# Patient Record
Sex: Male | Born: 1965
Health system: Southern US, Community
[De-identification: ages and names within clinical notes are randomized; demographics above are authoritative.]

## PROBLEM LIST (undated history)

## (undated) DIAGNOSIS — E785 Hyperlipidemia, unspecified: Secondary | ICD-10-CM

## (undated) DIAGNOSIS — F419 Anxiety disorder, unspecified: Secondary | ICD-10-CM

## (undated) DIAGNOSIS — F32A Depression, unspecified: Secondary | ICD-10-CM

## (undated) DIAGNOSIS — N189 Chronic kidney disease, unspecified: Secondary | ICD-10-CM

## (undated) DIAGNOSIS — F329 Major depressive disorder, single episode, unspecified: Secondary | ICD-10-CM

## (undated) DIAGNOSIS — I10 Essential (primary) hypertension: Secondary | ICD-10-CM

## (undated) HISTORY — PX: VASECTOMY: SHX75

## (undated) HISTORY — DX: Depression, unspecified: F32.A

## (undated) HISTORY — DX: Anxiety disorder, unspecified: F41.9

## (undated) HISTORY — DX: Hyperlipidemia, unspecified: E78.5

## (undated) HISTORY — DX: Major depressive disorder, single episode, unspecified: F32.9

## (undated) HISTORY — DX: Essential (primary) hypertension: I10

## (undated) HISTORY — DX: Chronic kidney disease, unspecified: N18.9

---

## 2011-07-19 LAB — CBC AND DIFFERENTIAL
HCT: 44 % (ref 41–53)
Hemoglobin: 15.3 g/dL (ref 13.5–17.5)
Platelets: 246 10*3/uL (ref 150–399)
WBC: 5.4 10^3/mL

## 2011-07-19 LAB — BASIC METABOLIC PANEL
BUN: 14 mg/dL (ref 4–21)
Creatinine: 1.2 mg/dL (ref 0.6–1.3)
Glucose: 103 mg/dL
Potassium: 4.5 mmol/L (ref 3.4–5.3)
Sodium: 140 mmol/L (ref 137–147)

## 2011-07-19 LAB — LIPID PANEL
Cholesterol: 176 mg/dL (ref 0–200)
HDL: 40 mg/dL (ref 35–70)
LDL Cholesterol: 103 mg/dL
Triglycerides: 168 mg/dL — AB (ref 40–160)

## 2011-07-19 LAB — HEPATIC FUNCTION PANEL
ALT: 14 U/L (ref 10–40)
AST: 15 U/L (ref 14–40)
Alkaline Phosphatase: 47 U/L (ref 25–125)
Bilirubin, Total: 0.8 mg/dL

## 2012-08-24 ENCOUNTER — Encounter: Payer: Self-pay | Admitting: Family Medicine

## 2012-08-24 ENCOUNTER — Ambulatory Visit (INDEPENDENT_AMBULATORY_CARE_PROVIDER_SITE_OTHER): Payer: BC Managed Care – PPO | Admitting: Family Medicine

## 2012-08-24 VITALS — BP 115/72 | HR 63 | Temp 98.4°F | Resp 18 | Ht 71.0 in | Wt 239.0 lb

## 2012-08-24 DIAGNOSIS — F32A Depression, unspecified: Secondary | ICD-10-CM

## 2012-08-24 DIAGNOSIS — Z13220 Encounter for screening for lipoid disorders: Secondary | ICD-10-CM

## 2012-08-24 DIAGNOSIS — F341 Dysthymic disorder: Secondary | ICD-10-CM

## 2012-08-24 DIAGNOSIS — Z131 Encounter for screening for diabetes mellitus: Secondary | ICD-10-CM

## 2012-08-24 DIAGNOSIS — F419 Anxiety disorder, unspecified: Secondary | ICD-10-CM

## 2012-08-24 DIAGNOSIS — F329 Major depressive disorder, single episode, unspecified: Secondary | ICD-10-CM | POA: Insufficient documentation

## 2012-08-24 MED ORDER — CITALOPRAM HYDROBROMIDE 20 MG PO TABS: 20.0000 mg | ORAL_TABLET | Freq: Every day | ORAL | Status: DC

## 2012-08-24 NOTE — Progress Notes (Signed)
CC: Gabriel Little is a 47 y.o. male is here for Establish Care   Subjective: HPI:  Pleasant 47 year old here to establish care  He reports a long-standing history of depression and anxiety originally moderate in severity no improvement on Zoloft 3 years ago he switched to citalopram has had fantastic response up until the last few weeks. He is taking 10 mg a daily basis but expresses frustration with early morning awakening most nights of the week this occurs with thoughts of worrying about finances, job responsibilities. Symptoms are described as mild/moderate in severity but interfering with quality of life. He currently denies any depression, possibly to harm himself or others, mental disturbance other than that above.  Denies alcohol use tobacco use or recreational drug use.  Since being on Celexa he has not had any sexual dysfunction complaints or concerns. Above symptoms seemed to improve with distractions. Symptoms are gradually been worsening since onset 2- 3 weeks ago.   He believes it's been well over a year since cholesterol and diabetes screening  Review of Systems - General ROS: negative for - chills, fever, night sweats, weight gain or weight loss Ophthalmic ROS: negative for - decreased vision Psychological ROS: negative for - anxiety or depression ENT ROS: negative for - hearing change, nasal congestion, tinnitus or allergies Hematological and Lymphatic ROS: negative for - bleeding problems, bruising or swollen lymph nodes Breast ROS: negative Respiratory ROS: no cough, shortness of breath, or wheezing Cardiovascular ROS: no chest pain or dyspnea on exertion Gastrointestinal ROS: no abdominal pain, change in bowel habits, or black or bloody stools Genito-Urinary ROS: negative for - genital discharge, genital ulcers, incontinence or abnormal bleeding from genitals Musculoskeletal ROS: negative for - joint pain or muscle pain Neurological ROS: negative for - headaches or memory  loss Dermatological ROS: negative for lumps, mole changes, rash and skin lesion changes  Past Medical History  Diagnosis Date  . Depression   . Anxiety      Family History  Problem Relation Age of Onset  . Heart attack Mother   . Cancer Father     prostate  . Heart attack Father   . Heart attack Brother   . Depression Brother      History  Substance Use Topics  . Smoking status: Never Smoker   . Smokeless tobacco: Not on file  . Alcohol Use: No     Objective: Filed Vitals:   08/24/12 0937  BP: 115/72  Pulse: 63  Temp: 98.4 F (36.9 C)  Resp: 18    General: Alert and Oriented, No Acute Distress HEENT: Pupils equal, round, reactive to light. Conjunctivae clear.  External ears unremarkable, canals clear with intact TMs with appropriate landmarks.  Middle ear appears open without effusion. Pink inferior turbinates.  Moist mucous membranes, pharynx without inflammation nor lesions.  Neck supple without palpable lymphadenopathy nor abnormal masses. Lungs: Clear to auscultation bilaterally, no wheezing/ronchi/rales.  Comfortable work of breathing. Good air movement. Cardiac: Regular rate and rhythm. Normal S1/S2.  No murmurs, rubs, nor gallops.   Extremities: No peripheral edema.  Strong peripheral pulses.  Mental Status: No depression, anxiety, nor agitation. Skin: Warm and dry.  Assessment & Plan: Edd was seen today for establish care.  Diagnoses and associated orders for this visit:  Anxiety and depression - citalopram (CELEXA) 20 MG tablet; Take 1 tablet (20 mg total) by mouth daily.  Lipid screening - Lipid panel  Diabetes mellitus screening - BASIC METABOLIC PANEL WITH GFR  Other Orders - Discontinue:  citalopram (CELEXA) 10 MG tablet; Take 10 mg by mouth daily.    Anxiety and depression: Chronic uncontrolled condition increasing Celexa to 20 mg notify me in 2 weeks if symptoms are improving or worsening. He is due for diabetes screening and fasting  lipid panel which he will have obtained some time next week. Lab slip given today  Return in about 6 months (around 02/23/2013).

## 2012-10-09 ENCOUNTER — Encounter: Payer: Self-pay | Admitting: Family Medicine

## 2012-10-09 DIAGNOSIS — L301 Dyshidrosis [pompholyx]: Secondary | ICD-10-CM | POA: Insufficient documentation

## 2012-10-09 DIAGNOSIS — N6310 Unspecified lump in the right breast, unspecified quadrant: Secondary | ICD-10-CM | POA: Insufficient documentation

## 2012-10-11 ENCOUNTER — Encounter: Payer: Self-pay | Admitting: *Deleted

## 2013-01-29 ENCOUNTER — Other Ambulatory Visit: Payer: Self-pay | Admitting: Family Medicine

## 2013-02-04 ENCOUNTER — Other Ambulatory Visit: Payer: Self-pay | Admitting: Family Medicine

## 2013-07-31 ENCOUNTER — Other Ambulatory Visit: Payer: Self-pay | Admitting: Family Medicine

## 2013-11-05 ENCOUNTER — Other Ambulatory Visit: Payer: Self-pay | Admitting: Family Medicine

## 2013-12-09 ENCOUNTER — Telehealth: Payer: Self-pay

## 2013-12-09 ENCOUNTER — Other Ambulatory Visit: Payer: Self-pay | Admitting: Family Medicine

## 2013-12-09 NOTE — Telephone Encounter (Signed)
Left a message for patient to call and schedule an appointment.

## 2013-12-14 ENCOUNTER — Other Ambulatory Visit: Payer: Self-pay | Admitting: Family Medicine

## 2013-12-19 ENCOUNTER — Other Ambulatory Visit: Payer: Self-pay | Admitting: *Deleted

## 2013-12-19 ENCOUNTER — Other Ambulatory Visit: Payer: Self-pay | Admitting: Family Medicine

## 2013-12-19 MED ORDER — CITALOPRAM HYDROBROMIDE 20 MG PO TABS: 20.0000 mg | ORAL_TABLET | Freq: Every day | ORAL | Status: DC

## 2013-12-19 NOTE — Progress Notes (Signed)
Refilled medicine to bridge til appointment. Corliss SkainsJamie Reegan Bouffard, CMA

## 2014-01-03 ENCOUNTER — Ambulatory Visit (INDEPENDENT_AMBULATORY_CARE_PROVIDER_SITE_OTHER): Payer: BC Managed Care – PPO | Admitting: Family Medicine

## 2014-01-03 ENCOUNTER — Encounter: Payer: Self-pay | Admitting: Family Medicine

## 2014-01-03 VITALS — BP 135/86 | HR 80 | Wt 252.0 lb

## 2014-01-03 DIAGNOSIS — F419 Anxiety disorder, unspecified: Principal | ICD-10-CM

## 2014-01-03 DIAGNOSIS — M51369 Other intervertebral disc degeneration, lumbar region without mention of lumbar back pain or lower extremity pain: Secondary | ICD-10-CM | POA: Insufficient documentation

## 2014-01-03 DIAGNOSIS — F32A Depression, unspecified: Secondary | ICD-10-CM

## 2014-01-03 DIAGNOSIS — M5136 Other intervertebral disc degeneration, lumbar region: Secondary | ICD-10-CM

## 2014-01-03 DIAGNOSIS — F418 Other specified anxiety disorders: Secondary | ICD-10-CM | POA: Diagnosis not present

## 2014-01-03 DIAGNOSIS — M545 Low back pain, unspecified: Secondary | ICD-10-CM

## 2014-01-03 DIAGNOSIS — F329 Major depressive disorder, single episode, unspecified: Secondary | ICD-10-CM

## 2014-01-03 MED ORDER — MELOXICAM 15 MG PO TABS: 15.0000 mg | ORAL_TABLET | Freq: Every day | ORAL | Status: DC | PRN

## 2014-01-03 MED ORDER — CITALOPRAM HYDROBROMIDE 20 MG PO TABS
ORAL_TABLET | ORAL | Status: DC
Start: 2014-01-03 — End: 2015-01-08

## 2014-01-03 NOTE — Progress Notes (Signed)
CC: Gabriel Little is a 48 y.o. male is here for depression/anxiety f/u   Subjective: HPI:   Follow-up anxiety:  Since starting new dose of citalopram states that he no longer has any degree of anxiety, nervousness, nor any mental stress. Denies any thoughts with harming self or others. Denies depression. No other mental disturbance. Sleeping well.  Complains of back pain that has been present on almost daily basis for the past 10+ years. It is mild in severity. Worse with doing dips, squats, or bending over for long time. Improves with rest. No other interventions as of yet. It is described as soreness in the muscles paralleling the spine mostly in the low back. Denies any recent over exertion or trauma. Pain is nonradiating. Denies saddle paresthesia nor any motor or sensory disturbances.he's had an MRI in the past that showed degenerative disc disease in the lumbar spine.   Review Of Systems Outlined In HPI  Past Medical History  Diagnosis Date  . Depression   . Anxiety     Past Surgical History  Procedure Laterality Date  . Vasectomy     Family History  Problem Relation Age of Onset  . Heart attack Mother   . Cancer Father     prostate  . Heart attack Father   . Heart attack Brother   . Depression Brother     History   Social History  . Marital Status: Married    Spouse Name: N/A    Number of Children: N/A  . Years of Education: N/A   Occupational History  . Not on file.   Social History Main Topics  . Smoking status: Never Smoker   . Smokeless tobacco: Not on file  . Alcohol Use: No  . Drug Use: No  . Sexual Activity: Yes    Birth Control/ Protection: None     Comment: vasectomy   Other Topics Concern  . Not on file   Social History Narrative     Objective: BP 135/86 mmHg  Pulse 80  Wt 252 lb (114.306 kg)  General: Alert and Oriented, No Acute Distress HEENT: Pupils equal, round, reactive to light. Conjunctivae clear.  Moist mucousmembranes times  unremarkable Lungs: clear comfortable work of breathing Cardiac: Regular rate and rhythm. Back: Score test negative bilaterally. Full range of motion and strength in all 3 planes of the lumbar and thoracic spine. No midline spinous process tenderness. No pain in the paraspinal musculature with palpation. L4 and S1 DTR 2 over 4 bilaterally and full range of motion and strengthof the lower extremities Extremities: No peripheral edema.  Strong peripheral pulses.  Mental Status: No depression, anxiety, nor agitation. Skin: Warm and dry.  Assessment & Plan: Gabriel Little was seen today for depression/anxiety f/u.  Diagnoses and associated orders for this visit:  Anxiety and depression - citalopram (CELEXA) 20 MG tablet; TAKE ONE TABLET BY MOUTH ONCE DAILY  Midline low back pain without sciatica - meloxicam (MOBIC) 15 MG tablet; Take 1 tablet (15 mg total) by mouth daily as needed for pain.  Lumbar degenerative disc disease    Anxiety: Controlled continue citalopram 20 mg. Midline back pain: Pain is most likely due to paraspinal musculature tensing up due to degenerative disc disease discussed core workouts to help stability. Meloxicam as needed to help. Return if worsening or any radicular symptoms.  Return in about 6 months (around 07/04/2014).

## 2014-04-07 ENCOUNTER — Telehealth: Payer: Self-pay

## 2014-04-07 ENCOUNTER — Encounter: Payer: Self-pay | Admitting: Family Medicine

## 2014-04-07 ENCOUNTER — Ambulatory Visit (INDEPENDENT_AMBULATORY_CARE_PROVIDER_SITE_OTHER): Payer: BLUE CROSS/BLUE SHIELD | Admitting: Family Medicine

## 2014-04-07 VITALS — BP 152/91 | HR 73 | Wt 262.0 lb

## 2014-04-07 DIAGNOSIS — M5136 Other intervertebral disc degeneration, lumbar region: Secondary | ICD-10-CM | POA: Diagnosis not present

## 2014-04-07 MED ORDER — CELECOXIB 100 MG PO CAPS: 100.0000 mg | ORAL_CAPSULE | Freq: Two times a day (BID) | ORAL | Status: DC

## 2014-04-07 NOTE — Progress Notes (Signed)
CC: Gabriel Little is a 49 y.o. male is here for Back Pain   Subjective: HPI:  Complains of back pain that has been present on a daily basis for the last 10 years. He's tried meloxicam and he is uncertain whether or not it was helpful however he thinks it was not beneficial. Pain is localized just to the left and right of the low lumbar spine. It doesn't seem to have any central midline component. It's worse when lifting heavy objects such as a tire or doing yard work. Pain is at least mild in severity when up walking around. Slightly improved with sitting down and resting. Pain is nonradiating described as a stiffness and tightness. Symptoms are bad enough to where his 49 year old father is able to do more physical activity than the patient himself. Denies saddle paresthesia or radiation of pain, nor pain in the lower extremities. Denies bladder or bowel incontinence   Review Of Systems Outlined In HPI  Past Medical History  Diagnosis Date  . Depression   . Anxiety     Past Surgical History  Procedure Laterality Date  . Vasectomy     Family History  Problem Relation Age of Onset  . Heart attack Mother   . Cancer Father     prostate  . Heart attack Father   . Heart attack Brother   . Depression Brother     History   Social History  . Marital Status: Married    Spouse Name: N/A    Number of Children: N/A  . Years of Education: N/A   Occupational History  . Not on file.   Social History Main Topics  . Smoking status: Never Smoker   . Smokeless tobacco: Not on file  . Alcohol Use: No  . Drug Use: No  . Sexual Activity: Yes    Birth Control/ Protection: None     Comment: vasectomy   Other Topics Concern  . Not on file   Social History Narrative     Objective: BP 152/91 mmHg  Pulse 73  Wt 262 lb (118.842 kg) Vital signs reviewed. General: Alert and Oriented, No Acute Distress HEENT: Pupils equal, round, reactive to light. Conjunctivae clear.  External ears  unremarkable.  Moist mucous membranes. Lungs: Clear and comfortable work of breathing, speaking in full sentences without accessory muscle use. Cardiac: Regular rate and rhythm.  Neuro: , gait normal. Extremities: No peripheral edema.  Strong peripheral pulses.  Back: No midline spinous process tenderness to palpation, mildly hypertonic paraspinal lumbar musculature, straightening of the lumbar spine is noted, full range of motion and strength in both lower extremities with L4 and S1 DTR 2 over 4 bilaterally Skin: Warm and dry.  Assessment & Plan: [name] was seen today for back pain.  Diagnoses and associated orders for this visit:  Lumbar degenerative disc disease - celecoxib (CELEBREX) 100 MG capsule; Take 1 capsule (100 mg total) by mouth 2 (two) times daily.    His last MRI was a little over 6 years ago and showed degenerative disc disease. I'd like to see if Celebrex will help any of his pain however will refer to Dr. Karie Schwalbe in sports medicine to see if a new MRI is warranted.   Return if symptoms worsen or fail to improve, for 1-2 weeks with Dr. Karie Schwalbe for sports medicine evaluation.

## 2014-04-07 NOTE — Telephone Encounter (Signed)
Received PA for Celecoxib medication.  Called [facility] pharmacy to run through again.  WB

## 2014-04-14 ENCOUNTER — Encounter: Payer: Self-pay | Admitting: Sports Medicine

## 2014-04-14 ENCOUNTER — Ambulatory Visit (INDEPENDENT_AMBULATORY_CARE_PROVIDER_SITE_OTHER): Payer: BLUE CROSS/BLUE SHIELD | Admitting: Sports Medicine

## 2014-04-14 ENCOUNTER — Ambulatory Visit (INDEPENDENT_AMBULATORY_CARE_PROVIDER_SITE_OTHER): Payer: BLUE CROSS/BLUE SHIELD

## 2014-04-14 DIAGNOSIS — M5136 Other intervertebral disc degeneration, lumbar region: Secondary | ICD-10-CM

## 2014-04-14 DIAGNOSIS — M5185 Other intervertebral disc disorders, thoracolumbar region: Secondary | ICD-10-CM

## 2014-04-14 DIAGNOSIS — M51369 Other intervertebral disc degeneration, lumbar region without mention of lumbar back pain or lower extremity pain: Secondary | ICD-10-CM

## 2014-04-14 DIAGNOSIS — M5386 Other specified dorsopathies, lumbar region: Secondary | ICD-10-CM

## 2014-04-14 DIAGNOSIS — M5184 Other intervertebral disc disorders, thoracic region: Secondary | ICD-10-CM

## 2014-04-14 MED ORDER — CYCLOBENZAPRINE HCL 10 MG PO TABS: ORAL_TABLET | ORAL | Status: DC

## 2014-04-14 NOTE — Assessment & Plan Note (Signed)
Symptoms are classic for lumbar degenerative disc disease without radiculopathy. Pain is discogenic and axial. Formal physical therapy, x-rays, adding Flexeril at bedtime, continue Celebrex which is effective. Return to see me in 6 weeks, MRI for interventional if no better.

## 2014-04-14 NOTE — Progress Notes (Signed)
   Subjective:    I'm seeing this patient as a consultation for:  Dr. HoIvan Anchorsmmel  CC: Low back pain  HPI: This is a very pleasant 49 year old male who comes in with a several year history of pain in his back, midline, without radiation or radiculopathy, worse with flexion. He is a big time weightlifter, and notes pain with deep squats. He's never had physical therapy, he was recently switched from meloxicam to Celebrex and has noted an improvement however continues to have nighttime symptoms. No constitutional symptoms, or bowel or bladder dysfunction. Not worse with Valsalva.  Past medical history, Surgical history, Family history not pertinant except as noted below, Social history, Allergies, and medications have been entered into the medical record, reviewed, and no changes needed.   Review of Systems: No headache, visual changes, nausea, vomiting, diarrhea, constipation, dizziness, abdominal pain, skin rash, fevers, chills, night sweats, weight loss, swollen lymph nodes, body aches, joint swelling, muscle aches, chest pain, shortness of breath, mood changes, visual or auditory hallucinations.   Objective:   General: Well Developed, well nourished, and in no acute distress.  Neuro/Psych: Alert and oriented x3, extra-ocular muscles intact, able to move all 4 extremities, sensation grossly intact. Skin: Warm and dry, no rashes noted.  Respiratory: Not using accessory muscles, speaking in full sentences, trachea midline.  Cardiovascular: Pulses palpable, no extremity edema. Abdomen: Does not appear distended.  Back Exam:  Inspection: Unremarkable  Motion: Flexion 45 deg, Extension 45 deg, Side Bending to 45 deg bilaterally,  Rotation to 45 deg bilaterally  SLR laying: Negative  XSLR laying: Negative  Palpable tenderness: None. FABER: negative. Sensory change: Gross sensation intact to all lumbar and sacral dermatomes.  Reflexes: 2+ at both patellar tendons, 2+ at achilles tendons,  Babinski's downgoing.  Strength at foot  Plantar-flexion: 5/5 Dorsi-flexion: 5/5 Eversion: 5/5 Inversion: 5/5  Leg strength  Quad: 5/5 Hamstring: 5/5 Hip flexor: 5/5 Hip abductors: 5/5  Gait unremarkable.  Impression and Recommendations:   This case required medical decision making of moderate complexity.

## 2014-05-01 ENCOUNTER — Ambulatory Visit: Payer: BLUE CROSS/BLUE SHIELD | Admitting: Physical Therapy

## 2014-05-12 ENCOUNTER — Ambulatory Visit (INDEPENDENT_AMBULATORY_CARE_PROVIDER_SITE_OTHER): Payer: BLUE CROSS/BLUE SHIELD | Admitting: Physical Therapy

## 2014-05-12 DIAGNOSIS — M545 Low back pain, unspecified: Secondary | ICD-10-CM

## 2014-05-12 NOTE — Therapy (Signed)
[facility] [place] [address] [place], [place], [address] Phone: [phone]   Fax:  [phone]  Physical Therapy Evaluation  Patient Details  Name: Gabriel Little MRN: [id number] Date of Birth: Nov 09, 1965 Referring Provider:  Monica Becton,*  Encounter Date: 05/12/2014      PT End of Session - 05/12/14 1023    Visit Number 1   Number of Visits 1   PT Start Time 0800   PT Stop Time 0838   PT Time Calculation (min) 38 min   Activity Tolerance Patient tolerated treatment well   Behavior During Therapy [facility] for tasks assessed/performed      Past Medical History  Diagnosis Date  . Depression   . Anxiety     Past Surgical History  Procedure Laterality Date  . Vasectomy      There were no vitals filed for this visit.  Visit Diagnosis:  Midline low back pain without sciatica - Plan: PT plan of care cert/re-cert      Subjective Assessment - 05/12/14 0802    Symptoms Pt is a 49 y/o male who presents to OPPT with long standing hx (20 years) of low back pain.  Pt reports MD stated "classic degenerative disc."     Pertinent History played college football [employer]   Limitations Sitting;Standing;Walking   How long can you sit comfortably? 4-5 hours   How long can you stand comfortably? couple hours   How long can you walk comfortably? 10 min   Currently in Pain? No/denies   Pain Score 8   at worst   Pain Location Back   Pain Orientation Lower;Left   Pain Descriptors / Indicators Tightness   Pain Onset More than a month ago   Aggravating Factors  lifting weights            [facility] PT Assessment - 05/12/14 0805    Assessment   Medical Diagnosis low back pain   Onset Date --  20 year history   Prior Therapy none   Precautions   Precautions None   Restrictions   Weight Bearing Restrictions No   Balance Screen   Has the patient fallen in the past 6 months No   Has the patient had a decrease in  activity level because of a fear of falling?  No   Is the patient reluctant to leave their home because of a fear of falling?  No   Home Environment   Living Enviornment Private residence   Prior Function   Level of Independence Independent with basic ADLs;Independent with gait;Independent with transfers   Vocation Full time employment   [employer]; includes travel and sitting at desk   Leisure lift weights 3 days/wk   Cognition   Overall Cognitive Status Within Functional Limits for tasks assessed   Observation/Other Assessments   Focus on Therapeutic Outcomes (FOTO)  56 (44% limited)   Posture/Postural Control   Posture/Postural Control Postural limitations   Postural Limitations Rounded Shoulders;Forward head;Increased lumbar lordosis   AROM   AROM Assessment Site Lumbar   Lumbar Flexion 74   Lumbar Extension 29   Lumbar - Right Side Bend 35   Lumbar - Left Side Bend 35   Strength   Strength Assessment Site Hip;Knee;Ankle   Right/Left Hip Right;Left   Right Hip Flexion 5/5   Right Hip Extension 5/5   Right Hip ABduction 5/5   Right Hip ADduction 5/5   Left Hip Flexion 5/5   Left Hip  Extension 5/5   Left Hip ABduction 5/5   Left Hip ADduction 5/5   Right/Left Knee Left;Right   Right Knee Flexion 5/5   Right Knee Extension 5/5   Left Knee Flexion 5/5   Left Knee Extension 5/5   Right/Left Ankle Right;Left   Right Ankle Dorsiflexion 5/5   Left Ankle Dorsiflexion 5/5   Flexibility   Soft Tissue Assessment /Muscle Length yes   Hamstrings tight bil   Piriformis tight bil   Quadratus Lumborum tight bil   Palpation   Palpation tightness and tenderness noted along bil paraspinals and QL; L>R                   [facility] Adult PT Treatment/Exercise - 05/12/14 0805    Lumbar Exercises: Stretches   Passive Hamstring Stretch 2 reps;30 seconds   Passive Hamstring Stretch Limitations with strap   Double Knee to Chest Stretch 2 reps;30 seconds   Lower  Trunk Rotation 2 reps;30 seconds   Lumbar Exercises: Supine   Ab Set 10 reps   Clam 10 reps   Clam Limitations with ab set   Heel Slides 10 reps   Heel Slides Limitations with ab set   Bent Knee Raise 10 reps   Bent Knee Raise Limitations with ab set                PT Education - 05/12/14 1023    Education provided Yes   Education Details posture/body mechanics, core strengthening HEP and lumbar flexibility HEP   Person(s) Educated Patient   Methods Explanation;Demonstration;Handout   Comprehension Verbalized understanding;Returned demonstration                    Plan - 05/12/14 1023    Clinical Impression Statement Pt presents to OPPT with low back pain and tightness.  Pt demonstrates poor postural awareness along with weak core and back tighness.  Educated on mechanics to decrease risk of reinjury as well as HEP.  Pt requesting one time eval only due to financial limitations.    Pt will benefit from skilled therapeutic intervention in order to improve on the following deficits Improper body mechanics;Postural dysfunction;Pain;Impaired flexibility;Decreased range of motion   PT Treatment/Interventions ADLs/Self Care Home Management;Therapeutic exercise;Patient/family education   PT Next Visit Plan 1 time eval   Consulted and Agree with Plan of Care Patient         Problem List Patient Active Problem List   Diagnosis Date Noted  . Lumbar degenerative disc disease 01/03/2014  . Dyshidrotic eczema 10/09/2012  . Breast mass, right 10/09/2012  . Anxiety and depression 08/24/2012   Clarita CraneStephanie F Kynadie Yaun, PT, DPT 05/12/2014 10:27 AM  [facility] [address] [place], [place], [address] Phone: [phone]   Fax:  [phone]

## 2014-05-12 NOTE — Patient Instructions (Signed)
Sleeping on Back  Place pillow under knees. A pillow with cervical support and a roll around waist are also helpful. Copyright  VHI. All rights reserved.  Sleeping on Side Place pillow between knees. Use cervical support under neck and a roll around waist as needed. Copyright  VHI. All rights reserved.   Sleeping on Stomach   If this is the only desirable sleeping position, place pillow under lower legs, and under stomach or chest as needed.  Posture - Sitting   Sit upright, head facing forward. Try using a roll to support lower back. Keep shoulders relaxed, and avoid rounded back. Keep hips level with knees. Avoid crossing legs for long periods. Stand to Sit / Sit to Stand   To sit: Bend knees to lower self onto front edge of chair, then scoot back on seat. To stand: Reverse sequence by placing one foot forward, and scoot to front of seat. Use rocking motion to stand up.   Work Height and Reach  Ideal work height is no more than 2 to 4 inches below elbow level when standing, and at elbow level when sitting. Reaching should be limited to arm's length, with elbows slightly bent.  Bending  Bend at hips and knees, not back. Keep feet shoulder-width apart.    Posture - Standing   Good posture is important. Avoid slouching and forward head thrust. Maintain curve in low back and align ears over shoul- ders, hips over ankles.  Alternating Positions   Alternate tasks and change positions frequently to reduce fatigue and muscle tension. Take rest breaks. Computer Work   Position work to face forward. Use proper work and seat height. Keep shoulders back and down, wrists straight, and elbows at right angles. Use chair that provides full back support. Add footrest and lumbar roll as needed.  Getting Into / Out of Car  Lower self onto seat, scoot back, then bring in one leg at a time. Reverse sequence to get out.  Dressing  Lie on back to pull socks or slacks over feet, or sit  and bend leg while keeping back straight.    Housework - Sink  Place one foot on ledge of cabinet under sink when standing at sink for prolonged periods.   Pushing / Pulling  Pushing is preferable to pulling. Keep back in proper alignment, and use leg muscles to do the work.  Deep Squat   Squat and lift with both arms held against upper trunk. Tighten stomach muscles without holding breath. Use smooth movements to avoid jerking.  Avoid Twisting   Avoid twisting or bending back. Pivot around using foot movements, and bend at knees if needed when reaching for articles.  Carrying Luggage   Distribute weight evenly on both sides. Use a cart whenever possible. Do not twist trunk. Move body as a unit.   Lifting Principles .Maintain proper posture and head alignment. .Slide object as close as possible before lifting. .Move obstacles out of the way. .Test before lifting; ask for help if too heavy. .Tighten stomach muscles without holding breath. .Use smooth movements; do not jerk. .Use legs to do the work, and pivot with feet. .Distribute the work load symmetrically and close to the center of trunk. .Push instead of pull whenever possible.   Ask For Help   Ask for help and delegate to others when possible. Coordinate your movements when lifting together, and maintain the low back curve.  Log Roll   Lying on back, bend left knee and place left   arm across chest. Roll all in one movement to the right. Reverse to roll to the left. Always move as one unit. Housework - Sweeping  Use long-handled equipment to avoid stooping.   Housework - Wiping  Position yourself as close as possible to reach work surface. Avoid straining your back.  Laundry - Unloading Wash   To unload small items at bottom of washer, lift leg opposite to arm being used to reach.  Gardening - Raking  Move close to area to be raked. Use arm movements to do the work. Keep back straight and avoid  twisting.     Cart  When reaching into cart with one arm, lift opposite leg to keep back straight.   Getting Into / Out of Bed  Lower self to lie down on one side by raising legs and lowering head at the same time. Use arms to assist moving without twisting. Bend both knees to roll onto back if desired. To sit up, start from lying on side, and use same move-ments in reverse. Housework - Vacuuming  Hold the vacuum with arm held at side. Step back and forth to move it, keeping head up. Avoid twisting.   Laundry - Armed forces training and education officer so that bending and twisting can be avoided.   Laundry - Unloading Dryer  Squat down to reach into clothes dryer or use a reacher.  Gardening - Weeding / Psychiatric nurse or Kneel. Knee pads may be helpful.                      PELVIC TILT  Lie on back, legs bent. Exhale, tilting top of pelvis back, pubic bone up, to flatten lower back. Inhale, rolling pelvis opposite way, top forward, pubic bone down, arch in back. Repeat __10__ times. Do __2__ sessions per day. Copyright  VHI. All rights reserved.    Knee Fold   Lie on back, legs bent, arms by sides. Exhale, lifting knee to chest. Inhale, returning. Keep abdominals flat, navel to spine. Repeat __10__ times, alternating legs. Do __2__ sessions per day.  Copyright  VHI. All rights reserved.  Knee Drop   Keep pelvis stable. Without rotating hips, slowly drop knee to side, pause, return to center, bring knee across midline toward opposite hip. Feel obliques engaging. Repeat for ___10_ times each leg.  Copyright  VHI. All rights reserved.  Isometric Hold With Pelvic Floor (Hook-Lying)    Copyright  VHI. All rights reserved.  Heel Slide to Straight   Slide one leg down to straight. Return. Be sure pelvis does not rock forward, tilt, rotate, or tip to side. Do _10__ times. Restabilize pelvis. Repeat with other leg. Do __1-2_ sets, __2_ times per  day.  http://ss.exer.us/16   Copyright  VHI. All rights reserved.     Double Knee to Chest (Flexion)   Gently pull both knees toward chest. Feel stretch in lower back or buttock area. Breathing deeply, Hold ___30_ seconds. Repeat __3__ times. Do _2___ sessions per day.  http://gt2.exer.us/227   Copyright  VHI. All rights reserved.  Piriformis (Supine)   Cross legs, right on top. Gently pull other knee toward chest until stretch is felt in buttock/hip of top leg. Hold _30___ seconds. Repeat ___3_ times per set.  Do __2__ sessions per day.  http://orth.exer.us/676   Copyright  VHI. All rights reserved.  Lower Trunk Rotation Stretch   Keeping back flat and feet together, rotate knees to left side. Hold ___30_ seconds. Repeat  __3_ times per set. . Do __2__ sessions per day.  http://orth.exer.us/122   Copyright  VHI. All rights reserved.    Hamstring Step 2   Left foot relaxed, knee straight, other leg bent, foot flat. Raise straight leg further upward to maximal range. Hold _30__ seconds. Relax leg completely down. Repeat ___ times.  Copyright  VHI. All rights reserved.  Side Twist, Kneeling   Kneel, buttocks on heels. Fold upper body forward from hips. Then reach to each side as far as possible, keeping chest low toward floor. Hold each position _30__ seconds. Repeat __3_ times per session. Do __2_ sessions per day.  Copyright  VHI. All rights reserved.    Clarita CraneStephanie F Dymphna Wadley, PT, DPT 05/12/2014 8:17 AM  [facility] [address] [place], [place] [address]  [phone] (office) [phone] (fax)

## 2014-05-26 ENCOUNTER — Ambulatory Visit: Payer: BLUE CROSS/BLUE SHIELD | Admitting: Sports Medicine

## 2015-01-08 ENCOUNTER — Other Ambulatory Visit: Payer: Self-pay | Admitting: Family Medicine

## 2015-01-13 ENCOUNTER — Other Ambulatory Visit: Payer: Self-pay | Admitting: Family Medicine

## 2015-02-09 ENCOUNTER — Other Ambulatory Visit: Payer: Self-pay | Admitting: Family Medicine

## 2015-02-17 ENCOUNTER — Ambulatory Visit (INDEPENDENT_AMBULATORY_CARE_PROVIDER_SITE_OTHER): Payer: BLUE CROSS/BLUE SHIELD | Admitting: Family Medicine

## 2015-02-17 ENCOUNTER — Encounter: Payer: Self-pay | Admitting: Family Medicine

## 2015-02-17 VITALS — BP 142/87 | HR 85 | Wt 250.0 lb

## 2015-02-17 DIAGNOSIS — F419 Anxiety disorder, unspecified: Principal | ICD-10-CM

## 2015-02-17 DIAGNOSIS — Z23 Encounter for immunization: Secondary | ICD-10-CM

## 2015-02-17 DIAGNOSIS — F329 Major depressive disorder, single episode, unspecified: Secondary | ICD-10-CM

## 2015-02-17 DIAGNOSIS — F418 Other specified anxiety disorders: Secondary | ICD-10-CM | POA: Diagnosis not present

## 2015-02-17 DIAGNOSIS — F32A Depression, unspecified: Secondary | ICD-10-CM

## 2015-02-17 MED ORDER — CITALOPRAM HYDROBROMIDE 20 MG PO TABS: 20.0000 mg | ORAL_TABLET | Freq: Every day | ORAL | Status: DC

## 2015-02-17 NOTE — Progress Notes (Signed)
CC: Gabriel Little is a 49 y.o. male is here for Anxiety   Subjective: HPI:  Follow-up anxiety: He would like a refill on citalopram. He denies any known side effects. He denies any anxiety or depression. He tells me he is very happy with life right now from a psychological standpoint. He's no longer worried about everything and anything. He denies any sleep disturbance. Denies thoughts of wanting to harm himself or others   Review Of Systems Outlined In HPI  Past Medical History  Diagnosis Date  . Depression   . Anxiety     Past Surgical History  Procedure Laterality Date  . Vasectomy     Family History  Problem Relation Age of Onset  . Heart attack Mother   . Cancer Father     prostate  . Heart attack Father   . Heart attack Brother   . Depression Brother     Social History   Social History  . Marital Status: Married    Spouse Name: N/A  . Number of Children: N/A  . Years of Education: N/A   Occupational History  . Not on file.   Social History Main Topics  . Smoking status: Never Smoker   . Smokeless tobacco: Not on file  . Alcohol Use: No  . Drug Use: No  . Sexual Activity: Yes    Birth Control/ Protection: None     Comment: vasectomy   Other Topics Concern  . Not on file   Social History Narrative     Objective: BP 142/87 mmHg  Pulse 85  Wt 250 lb (113.399 kg)  Vital signs reviewed. General: Alert and Oriented, No Acute Distress HEENT: Pupils equal, round, reactive to light. Conjunctivae clear.  External ears unremarkable.  Moist mucous membranes. Lungs: Clear and comfortable work of breathing, speaking in full sentences without accessory muscle use. Cardiac: Regular rate and rhythm.  Neuro: CN II-XII grossly intact, gait normal. Extremities: No peripheral edema.  Strong peripheral pulses.  Mental Status: No depression, anxiety, nor agitation. Logical though process. Skin: Warm and dry.  Assessment & Plan: Gabriel Little was seen today for  anxiety.  Diagnoses and all orders for this visit:  Anxiety and depression -     citalopram (CELEXA) 20 MG tablet; Take 1 tablet (20 mg total) by mouth daily.  Encounter for immunization  Other orders -     Flu Vaccine QUAD 36+ mos IM   Anxiety is controlledsigns of depression or anxiety therefore continue on citalopram daily  Return for .Marland Kitchen.Marland Kitchen.Marland Kitchen.Please come back around your [age] year birthday for a complete physical exam and blood work. .Marland Kitchen

## 2015-11-18 IMAGING — DX DG LUMBAR SPINE COMPLETE 4+V
5 series · 5 of 5 positions shown · non-contrast
Comparison: None.

CLINICAL DATA: 48-year-old with at least 10 year history of lumbar
pain. Patient denies radiculopathy. No known injuries.

EXAM:
LUMBAR SPINE - COMPLETE 4+ VIEW

[l-spine ap]
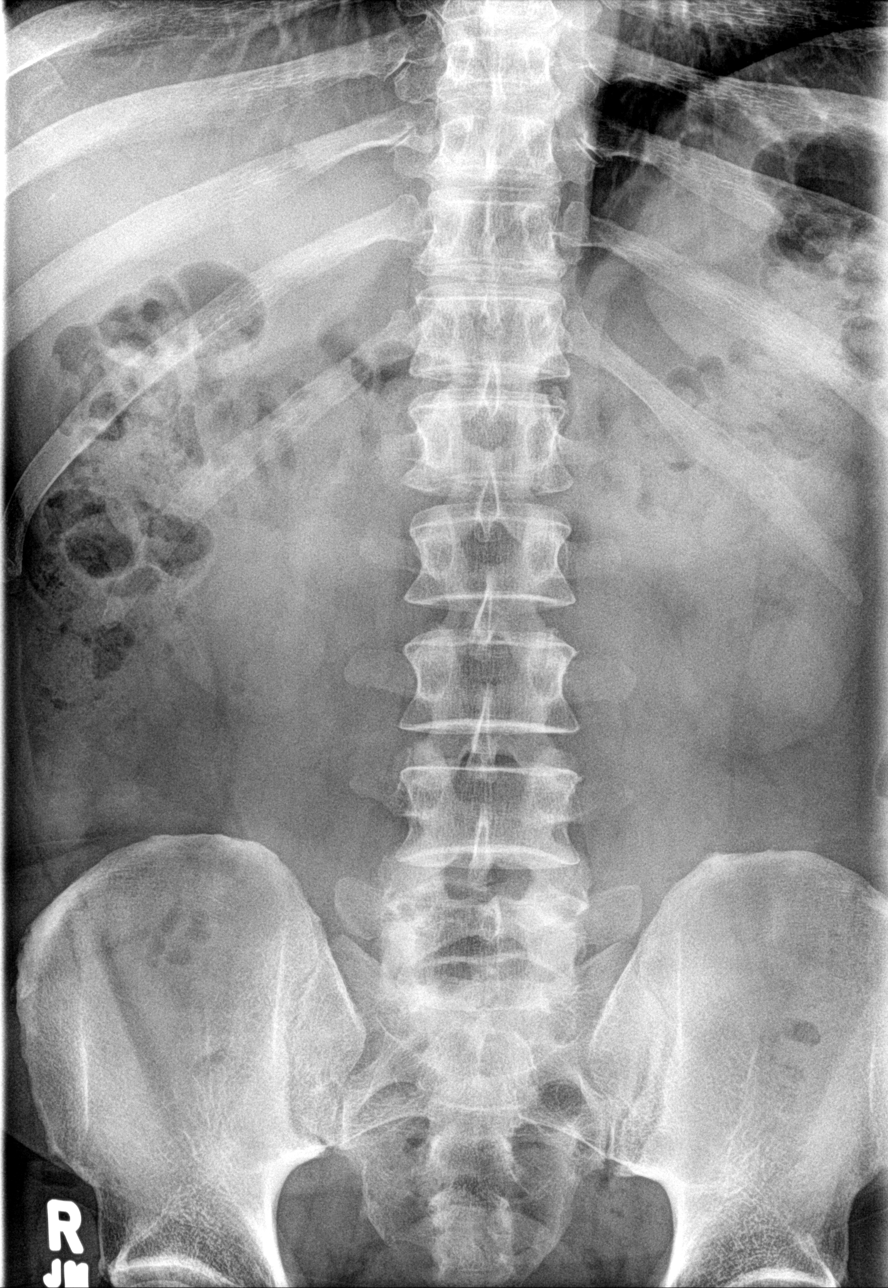

[l-spine obl (1 of 2)]
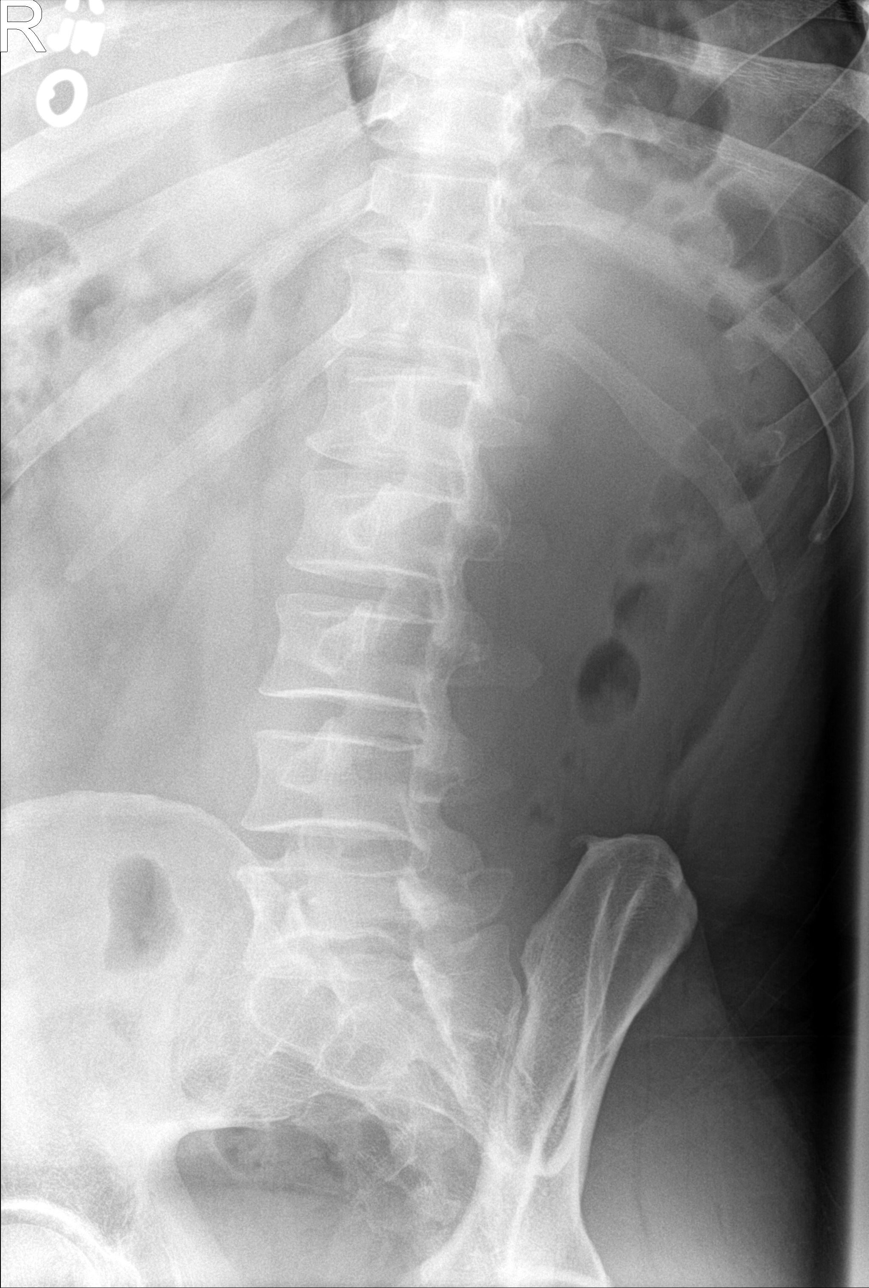

[l-spine obl (2 of 2)]
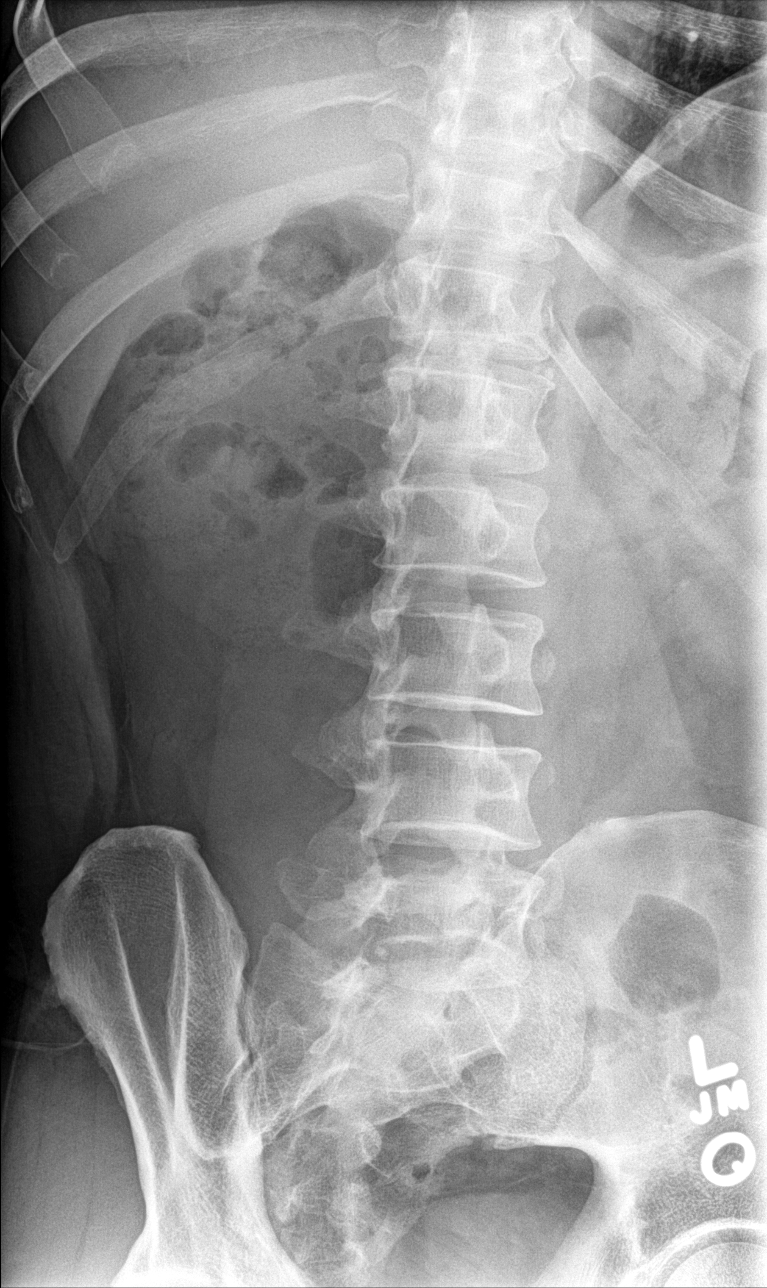

[l-spine lat]
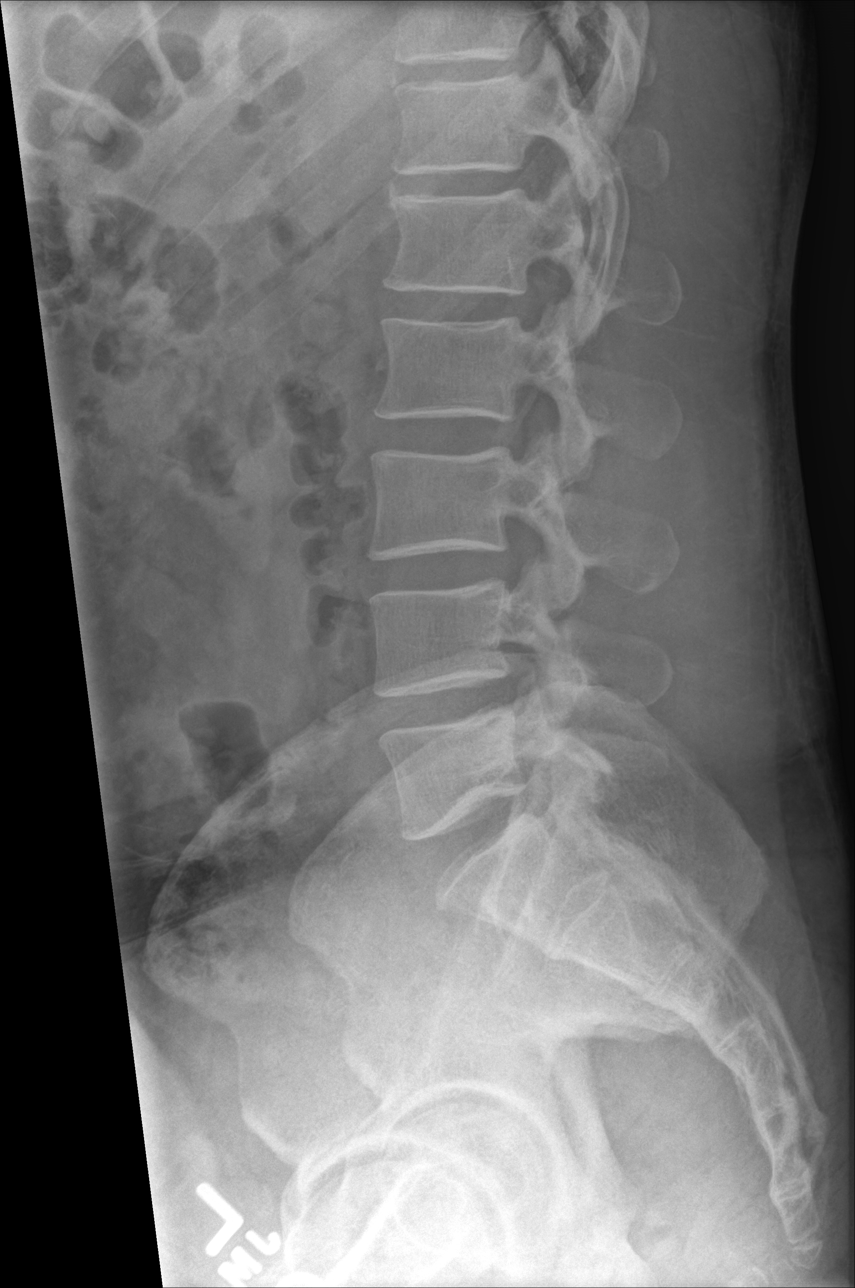

[l-spine lat l5-s1]
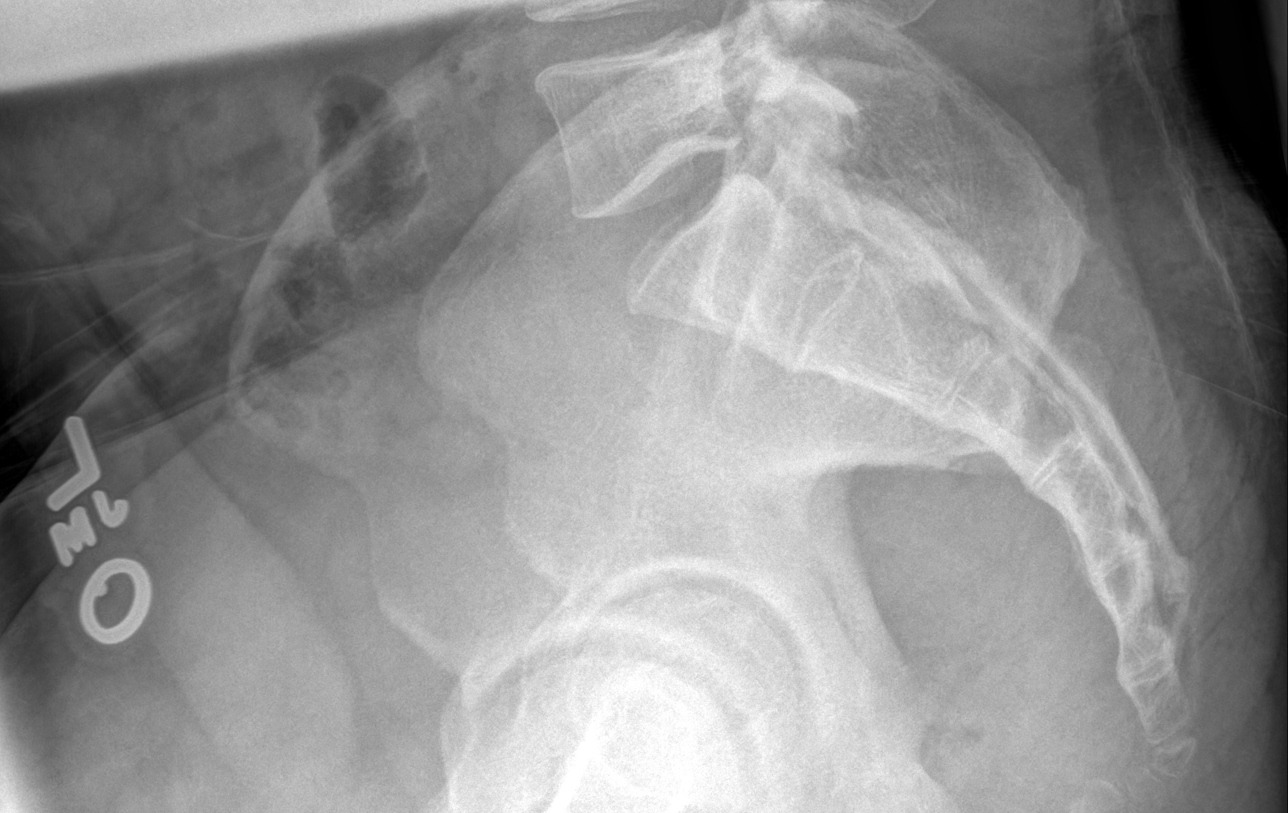

[5 of 5 positions shown; findings below may reference images not displayed]

FINDINGS: Five non rib-bearing lumbar vertebrae with anatomic alignment.
Straightening of the usual lumbar lordosis. No fractures. Well
preserved disc spaces. No pars defects. No significant facet
arthropathy. Visualized sacroiliac joints intact. Calcification in
the anterior annular fibers of T11-12 and T12-L1.
IMPRESSION: 1. No acute or significant osseous abnormality.
2. Straightening of the usual lumbar lordosis which may reflect
positioning and/or spasm.

## 2016-02-23 ENCOUNTER — Other Ambulatory Visit: Payer: Self-pay

## 2016-02-23 DIAGNOSIS — L579 Skin changes due to chronic exposure to nonionizing radiation, unspecified: Secondary | ICD-10-CM | POA: Diagnosis not present

## 2016-02-23 DIAGNOSIS — D492 Neoplasm of unspecified behavior of bone, soft tissue, and skin: Secondary | ICD-10-CM | POA: Diagnosis not present

## 2016-02-23 DIAGNOSIS — L439 Lichen planus, unspecified: Secondary | ICD-10-CM | POA: Diagnosis not present

## 2016-02-23 DIAGNOSIS — L2082 Flexural eczema: Secondary | ICD-10-CM | POA: Diagnosis not present

## 2016-02-23 DIAGNOSIS — F419 Anxiety disorder, unspecified: Principal | ICD-10-CM

## 2016-02-23 DIAGNOSIS — D485 Neoplasm of uncertain behavior of skin: Secondary | ICD-10-CM | POA: Diagnosis not present

## 2016-02-23 DIAGNOSIS — F329 Major depressive disorder, single episode, unspecified: Secondary | ICD-10-CM

## 2016-02-23 DIAGNOSIS — F32A Depression, unspecified: Secondary | ICD-10-CM

## 2016-02-23 MED ORDER — CITALOPRAM HYDROBROMIDE 20 MG PO TABS: 20.0000 mg | ORAL_TABLET | Freq: Every day | ORAL | 0 refills | Status: DC

## 2016-02-24 ENCOUNTER — Telehealth: Payer: Self-pay | Admitting: Family Medicine

## 2016-02-24 NOTE — Telephone Encounter (Signed)
Left a VM stating that pt needs to call and make appt to establish care with another pcp since Dr.Hommel is no longer here

## 2016-03-22 ENCOUNTER — Other Ambulatory Visit: Payer: Self-pay | Admitting: Family Medicine

## 2016-03-22 DIAGNOSIS — F329 Major depressive disorder, single episode, unspecified: Secondary | ICD-10-CM

## 2016-03-22 DIAGNOSIS — F419 Anxiety disorder, unspecified: Principal | ICD-10-CM

## 2016-03-22 DIAGNOSIS — F32A Depression, unspecified: Secondary | ICD-10-CM

## 2016-04-01 ENCOUNTER — Encounter: Payer: Self-pay | Admitting: Physician Assistant

## 2016-04-01 ENCOUNTER — Ambulatory Visit (INDEPENDENT_AMBULATORY_CARE_PROVIDER_SITE_OTHER): Payer: BLUE CROSS/BLUE SHIELD | Admitting: Physician Assistant

## 2016-04-01 VITALS — BP 125/85 | HR 79 | Wt 242.0 lb

## 2016-04-01 DIAGNOSIS — F418 Other specified anxiety disorders: Secondary | ICD-10-CM

## 2016-04-01 DIAGNOSIS — I10 Essential (primary) hypertension: Secondary | ICD-10-CM | POA: Diagnosis not present

## 2016-04-01 DIAGNOSIS — F32A Depression, unspecified: Secondary | ICD-10-CM

## 2016-04-01 DIAGNOSIS — R0683 Snoring: Secondary | ICD-10-CM | POA: Diagnosis not present

## 2016-04-01 DIAGNOSIS — G4719 Other hypersomnia: Secondary | ICD-10-CM | POA: Diagnosis not present

## 2016-04-01 DIAGNOSIS — R079 Chest pain, unspecified: Secondary | ICD-10-CM | POA: Diagnosis not present

## 2016-04-01 DIAGNOSIS — Z23 Encounter for immunization: Secondary | ICD-10-CM | POA: Diagnosis not present

## 2016-04-01 DIAGNOSIS — Z8249 Family history of ischemic heart disease and other diseases of the circulatory system: Secondary | ICD-10-CM | POA: Diagnosis not present

## 2016-04-01 DIAGNOSIS — E291 Testicular hypofunction: Secondary | ICD-10-CM | POA: Insufficient documentation

## 2016-04-01 DIAGNOSIS — F419 Anxiety disorder, unspecified: Secondary | ICD-10-CM

## 2016-04-01 DIAGNOSIS — Z0000 Encounter for general adult medical examination without abnormal findings: Secondary | ICD-10-CM | POA: Diagnosis not present

## 2016-04-01 DIAGNOSIS — F329 Major depressive disorder, single episode, unspecified: Secondary | ICD-10-CM

## 2016-04-01 LAB — COMPREHENSIVE METABOLIC PANEL
ALT: 30 U/L (ref 9–46)
AST: 23 U/L (ref 10–35)
Albumin: 4.6 g/dL (ref 3.6–5.1)
Alkaline Phosphatase: 36 U/L — ABNORMAL LOW (ref 40–115)
BUN: 22 mg/dL (ref 7–25)
CO2: 30 mmol/L (ref 20–31)
Calcium: 9 mg/dL (ref 8.6–10.3)
Chloride: 104 mmol/L (ref 98–110)
Creat: 1.32 mg/dL (ref 0.70–1.33)
Glucose, Bld: 96 mg/dL (ref 65–99)
Potassium: 4.1 mmol/L (ref 3.5–5.3)
Sodium: 137 mmol/L (ref 135–146)
Total Bilirubin: 1 mg/dL (ref 0.2–1.2)
Total Protein: 7.2 g/dL (ref 6.1–8.1)

## 2016-04-01 LAB — CBC
HCT: 48.4 % (ref 38.5–50.0)
Hemoglobin: 16.6 g/dL (ref 13.2–17.1)
MCH: 31.4 pg (ref 27.0–33.0)
MCHC: 34.3 g/dL (ref 32.0–36.0)
MCV: 91.5 fL (ref 80.0–100.0)
MPV: 9.7 fL (ref 7.5–12.5)
Platelets: 193 10*3/uL (ref 140–400)
RBC: 5.29 MIL/uL (ref 4.20–5.80)
RDW: 13.4 % (ref 11.0–15.0)
WBC: 4.5 10*3/uL (ref 3.8–10.8)

## 2016-04-01 LAB — LIPID PANEL
Cholesterol: 198 mg/dL (ref <200)
HDL: 50 mg/dL (ref >40)
LDL Cholesterol: 128 mg/dL — ABNORMAL HIGH (ref <100)
Total CHOL/HDL Ratio: 4 Ratio (ref <5.0)
Triglycerides: 99 mg/dL (ref <150)
VLDL: 20 mg/dL (ref <30)

## 2016-04-01 LAB — HEMOGLOBIN A1C
Hgb A1c MFr Bld: 4.9 % (ref <5.7)
Mean Plasma Glucose: 94 mg/dL

## 2016-04-01 MED ORDER — CITALOPRAM HYDROBROMIDE 20 MG PO TABS: 20.0000 mg | ORAL_TABLET | Freq: Every day | ORAL | 1 refills | Status: DC

## 2016-04-01 MED ORDER — LISINOPRIL 10 MG PO TABS: 10.0000 mg | ORAL_TABLET | Freq: Every day | ORAL | 0 refills | Status: DC

## 2016-04-01 NOTE — Patient Instructions (Addendum)
1. Cologuard will contact you about your colon cancer screening 2. You should be contacted within 5 business days about your sleep study and your exercise stress test (treadmill test for your heart) 3. We will contact you on Monday/Tuesday with your lab results 4. Start the Lisinopril 10mg  daily. Return in 2 weeks for a blood pressure check (nurse visit)  Physical Activity Recommendations for modifying lipids and lowering blood pressure Engage in aerobic physical activity to reduce LDL-cholesterol, non-HDL-cholesterol, and blood pressure  Frequency: 3-4 sessions per week  Intensity: moderate to vigorous  Duration: 40 minutes on average  Physical Activity Recommendations for secondary prevention 1. Aerobic exercise  Frequency: 3-5 sessions per week  Intensity: 50-80% capacity  Duration: 20 - 60 minutes  Examples: walking, treadmill, cycling, rowing, stair climbing, and arm/leg ergometry  2. Resistance exercise  Frequency: 2-3 sessions per week  Intensity: 10-15 repetitions/set to moderate fatigue  Duration: 1-3 sets of 8-10 upper and lower body exercises  Examples: calisthenics, elastic bands, cuff/hand weights, dumbbels, free weights, wall pulleys, and weight machines  Heart-Healthy Lifestyle  Eating a diet rich in vegetables, fruits and whole grains: also includes low-fat dairy products, poultry, fish, legumes, and nuts; limit intake of sweets, sugar-sweetened beverages and red meats  Getting regular exercise  Maintaining a healthy weight  Not smoking or getting help quitting  Staying on top of your health; for some people, lifestyle changes alone may not be enough to prevent a heart attack or stroke. In these cases, taking a statin at the right dose will most likely be necessary

## 2016-04-01 NOTE — Progress Notes (Signed)
HPI:                                                                Gabriel Little is a 51 y.o. male who [facility] today to establish care  Current Concerns include: blood pressure  Blood Pressure: Patient reports that his blood pressure has been elevated at his previous visits. His daughter has also checked his BP's at home and they have been in the 140's. He does endorse intermittent chest discomfort with exertion and "dizzy spells." Family history significant for heart disease in both of his parents and his brother.    Anxiety/Depression: patient has been taking citalopram for years. Well controlled. Denies depressed mood or anhedonia today. Denies SI/HI.  Libido: Patient also endorses decreased libido, lack of energy, decreased strength/endurance with weight lifting, decreased strength of erections, difficulty maintaining erections, and reports falling asleep after dinner.  Snoring: Patient also endorses snoring so loudly that he has been sleeping on the couch for a year. He has excessive daytime sleepiness. His wife has observed him stop breathing during his sleep.    Health Maintenance Health Maintenance  Topic Date Due  . HIV Screening  07/03/1980  . TETANUS/TDAP  07/03/1984  . COLONOSCOPY  07/04/2015  . INFLUENZA VACCINE  09/29/2015     Health Habits  Diet: fair  Exercise: regularly, weight training  ETOH: rarely  Tobacco: no  Drugs: no  Past Medical History:  Diagnosis Date  . Anxiety   . Depression    Past Surgical History:  Procedure Laterality Date  . VASECTOMY     Social History  Substance Use Topics  . Smoking status: Never Smoker  . Smokeless tobacco: Not on file  . Alcohol use No   family history includes Cancer in his father; Depression in his brother; Heart attack in his brother, father, and mother.  ROS: negative except as noted in the HPI  Medications: Current Outpatient  Prescriptions  Medication Sig Dispense Refill  . citalopram (CELEXA) 20 MG tablet Take 1 tablet (20 mg total) by mouth daily. Patient needs to establish care appointment. 30 tablet 0   No current facility-administered medications for this visit.    No Known Allergies     Objective:  BP (!) 141/90   Pulse 79   Wt 242 lb (109.8 kg)   BMI 33.75 kg/m  Gen: well-groomed, cooperative, not ill-appearing, no distress HEENT: normal conjunctiva, TM's clear, oropharynx clear, moist mucus membranes, no thyromegaly or tenderness Pulm: Normal work of breathing, clear to auscultation bilaterally CV: Normal rate, regular rhythm, s1 and s2 distinct, no murmurs, clicks or rubs appreciated on this exam, no carotid bruit GI: soft, nondistended, nontender, no masses Neuro: alert and oriented x 3, EOM's intact, PERRLA, DTR's intact MSK: strength 5/5 and symmetric, normal gait, distal pulses intact, no peripheral edema Skin: warm and dry, no rashes or lesions on exposed skin Psych: normal affect, pleasant mood, normal speech and thought content  I personally reviewed the ECG today which shows NSR at 69 bpm, normal PRI, and normal Qtc.   Assessment and Plan: 51 y.o. male with  1. Anxiety and depression, stable - negative PHQ-2 today - citalopram (CELEXA) 20 MG tablet; Take 1 tablet (20 mg  total) by mouth daily.  Dispense: 90 tablet; Refill: 1  2. Essential hypertension - baseline ECG obtained today  - due to exertional chest pain and dizziness and patient's strong family history of heart disease referring for exercise stress test - starting Lisinopril 10. Return in 2 weeks for BP check - lisinopril (PRINIVIL,ZESTRIL) 10 MG tablet; Take 1 tablet (10 mg total) by mouth daily.  Dispense: 30 tablet; Refill: 0  Encounter for preventative adult health care examination - CBC - Comprehensive metabolic panel - Hemoglobin A1c - Lipid panel - PSA, total and free - Cologuard ordered - influenza vaccine  given today  Hypogonadism male - Testosterone Total,Free,Bio, Males - if it requires treatment, will need to defer until sleep study has been completed  Snoring, Excessive daytime sleepiness - patient with positive STOPBANG screen 6/8 and high probability of OSA - Ambulatory referral to Sleep Studies   Patient education and anticipatory guidance given Patient agrees with treatment plan Follow-up in 2 weeks for BP check or sooner as needed  Levonne Hubert PA-C

## 2016-04-04 LAB — TESTOSTERONE TOTAL,FREE,BIO, MALES
Albumin: 4.6 g/dL (ref 3.6–5.1)
Sex Hormone Binding: 27 nmol/L (ref 10–50)
Testosterone: 194 ng/dL — ABNORMAL LOW (ref 250–827)

## 2016-04-04 LAB — PSA, TOTAL AND FREE
PSA, % Free: 17 % — ABNORMAL LOW (ref >25)
PSA, Free: 0.1 ng/mL
PSA, Total: 0.6 ng/mL (ref <=4.0)

## 2016-04-19 ENCOUNTER — Ambulatory Visit (INDEPENDENT_AMBULATORY_CARE_PROVIDER_SITE_OTHER): Payer: BLUE CROSS/BLUE SHIELD

## 2016-04-19 DIAGNOSIS — R079 Chest pain, unspecified: Secondary | ICD-10-CM

## 2016-04-19 DIAGNOSIS — I10 Essential (primary) hypertension: Secondary | ICD-10-CM

## 2016-04-19 DIAGNOSIS — Z8249 Family history of ischemic heart disease and other diseases of the circulatory system: Secondary | ICD-10-CM | POA: Diagnosis not present

## 2016-04-19 LAB — EXERCISE TOLERANCE TEST
Estimated workload: 11.2 METS
Exercise duration (min): 9 min
Exercise duration (sec): 38 s
MPHR: 170 {beats}/min
Peak HR: 155 {beats}/min
Percent HR: 91 %
RPE: 16
Rest HR: 79 {beats}/min

## 2016-04-20 ENCOUNTER — Encounter: Payer: Self-pay | Admitting: Physician Assistant

## 2016-04-20 DIAGNOSIS — IMO0001 Reserved for inherently not codable concepts without codable children: Secondary | ICD-10-CM | POA: Insufficient documentation

## 2016-05-20 ENCOUNTER — Ambulatory Visit: Payer: BLUE CROSS/BLUE SHIELD | Admitting: Physician Assistant

## 2016-09-29 ENCOUNTER — Other Ambulatory Visit: Payer: Self-pay | Admitting: Physician Assistant

## 2016-09-29 DIAGNOSIS — F419 Anxiety disorder, unspecified: Principal | ICD-10-CM

## 2016-09-29 DIAGNOSIS — F329 Major depressive disorder, single episode, unspecified: Secondary | ICD-10-CM

## 2016-09-29 DIAGNOSIS — F32A Depression, unspecified: Secondary | ICD-10-CM

## 2016-10-04 ENCOUNTER — Telehealth: Payer: Self-pay | Admitting: Physician Assistant

## 2016-10-04 NOTE — Telephone Encounter (Signed)
Called the pharmacy and was told they did receive the Rx for Celexa and that it was ready. Patient will be notified. Zayvian Mcmurtry,CMA

## 2016-10-04 NOTE — Telephone Encounter (Signed)
Patient requesting a refill on his Celexa. I saw that we sent one in on August 2nd; however, he stated that the pharmacy told him that it had not been received. Can we resend that to the pharmacy for him? Thanks!

## 2017-04-18 ENCOUNTER — Other Ambulatory Visit: Payer: Self-pay | Admitting: Physician Assistant

## 2017-04-18 DIAGNOSIS — F419 Anxiety disorder, unspecified: Principal | ICD-10-CM

## 2017-04-18 DIAGNOSIS — F329 Major depressive disorder, single episode, unspecified: Secondary | ICD-10-CM

## 2017-04-18 DIAGNOSIS — F32A Depression, unspecified: Secondary | ICD-10-CM

## 2017-04-25 ENCOUNTER — Other Ambulatory Visit: Payer: Self-pay | Admitting: Physician Assistant

## 2017-04-25 DIAGNOSIS — F329 Major depressive disorder, single episode, unspecified: Secondary | ICD-10-CM

## 2017-04-25 DIAGNOSIS — F419 Anxiety disorder, unspecified: Principal | ICD-10-CM

## 2017-04-25 DIAGNOSIS — F32A Depression, unspecified: Secondary | ICD-10-CM

## 2017-04-28 ENCOUNTER — Other Ambulatory Visit: Payer: Self-pay

## 2017-04-28 DIAGNOSIS — F419 Anxiety disorder, unspecified: Principal | ICD-10-CM

## 2017-04-28 DIAGNOSIS — F329 Major depressive disorder, single episode, unspecified: Secondary | ICD-10-CM

## 2017-04-28 DIAGNOSIS — F32A Depression, unspecified: Secondary | ICD-10-CM

## 2017-04-28 MED ORDER — CITALOPRAM HYDROBROMIDE 20 MG PO TABS: 20.0000 mg | ORAL_TABLET | Freq: Every day | ORAL | 0 refills | Status: DC

## 2017-04-29 ENCOUNTER — Other Ambulatory Visit: Payer: Self-pay | Admitting: Physician Assistant

## 2017-04-29 DIAGNOSIS — F329 Major depressive disorder, single episode, unspecified: Secondary | ICD-10-CM

## 2017-04-29 DIAGNOSIS — F32A Depression, unspecified: Secondary | ICD-10-CM

## 2017-04-29 DIAGNOSIS — F419 Anxiety disorder, unspecified: Principal | ICD-10-CM

## 2017-05-03 ENCOUNTER — Ambulatory Visit (INDEPENDENT_AMBULATORY_CARE_PROVIDER_SITE_OTHER): Payer: BLUE CROSS/BLUE SHIELD | Admitting: Physician Assistant

## 2017-05-03 ENCOUNTER — Encounter: Payer: Self-pay | Admitting: Physician Assistant

## 2017-05-03 VITALS — BP 153/89 | HR 92 | Wt 255.0 lb

## 2017-05-03 DIAGNOSIS — F329 Major depressive disorder, single episode, unspecified: Secondary | ICD-10-CM

## 2017-05-03 DIAGNOSIS — N179 Acute kidney failure, unspecified: Secondary | ICD-10-CM | POA: Diagnosis not present

## 2017-05-03 DIAGNOSIS — Z1389 Encounter for screening for other disorder: Secondary | ICD-10-CM | POA: Diagnosis not present

## 2017-05-03 DIAGNOSIS — Z130 Encounter for screening for diseases of the blood and blood-forming organs and certain disorders involving the immune mechanism: Secondary | ICD-10-CM | POA: Diagnosis not present

## 2017-05-03 DIAGNOSIS — E291 Testicular hypofunction: Secondary | ICD-10-CM | POA: Diagnosis not present

## 2017-05-03 DIAGNOSIS — F553 Abuse of steroids or hormones: Secondary | ICD-10-CM | POA: Diagnosis not present

## 2017-05-03 DIAGNOSIS — E785 Hyperlipidemia, unspecified: Secondary | ICD-10-CM | POA: Diagnosis not present

## 2017-05-03 DIAGNOSIS — F419 Anxiety disorder, unspecified: Secondary | ICD-10-CM | POA: Diagnosis not present

## 2017-05-03 DIAGNOSIS — I10 Essential (primary) hypertension: Secondary | ICD-10-CM

## 2017-05-03 DIAGNOSIS — R824 Acetonuria: Secondary | ICD-10-CM | POA: Diagnosis not present

## 2017-05-03 DIAGNOSIS — M5136 Other intervertebral disc degeneration, lumbar region: Secondary | ICD-10-CM | POA: Diagnosis not present

## 2017-05-03 DIAGNOSIS — F32A Depression, unspecified: Secondary | ICD-10-CM

## 2017-05-03 MED ORDER — CYCLOBENZAPRINE HCL 10 MG PO TABS: 10.0000 mg | ORAL_TABLET | Freq: Every evening | ORAL | 1 refills | Status: AC | PRN

## 2017-05-03 MED ORDER — LISINOPRIL 20 MG PO TABS: 20.0000 mg | ORAL_TABLET | Freq: Every day | ORAL | 5 refills | Status: AC

## 2017-05-03 MED ORDER — MELOXICAM 15 MG PO TABS: 15.0000 mg | ORAL_TABLET | Freq: Every day | ORAL | 2 refills | Status: AC | PRN

## 2017-05-03 MED ORDER — CITALOPRAM HYDROBROMIDE 20 MG PO TABS: 20.0000 mg | ORAL_TABLET | Freq: Every day | ORAL | 1 refills | Status: DC

## 2017-05-03 NOTE — Progress Notes (Signed)
HPI:                                                                Gabriel Little is a 52 y.o. male who presents to [facility]: Primary Care Sports Medicine today for medication management  HTN: never started Lisinopril. Does not check BP's at home. Denies vision change, headache, chest pain with exertion, orthopnea, lightheadedness, syncope and edema. Risk factors include: HLD, male sex, family history Of note, he had a negative stress test 04/19/2016  Hypogonadism: reports he is self-treating with anabolic steroids (deca and injectable testosterone weekly). He does endorse testicular atrophy.   Depression/Anxiety: taking Celexa 20 mg without difficulty. Stable on this regimen for years. Denies symptoms of mania/hypomania. Denies suicidal thinking. Denies auditory/visual hallucinations.   Depression screen [facility] 2/9 05/03/2017 04/01/2016  Decreased Interest 0 0  Down, Depressed, Hopeless 0 0  PHQ - 2 Score 0 0    No flowsheet data found.    Past Medical History:  Diagnosis Date  . Anxiety   . Chronic kidney disease   . Depression   . Hyperlipidemia   . Hypertension    Past Surgical History:  Procedure Laterality Date  . VASECTOMY     Social History   Tobacco Use  . Smoking status: Never Smoker  . Smokeless tobacco: Never Used  Substance Use Topics  . Alcohol use: Yes    Comment: occasional   family history includes Cancer in his father; Depression in his brother; Heart attack in his brother, father, and mother.    ROS: negative except as noted in the HPI  Medications: Current Outpatient Medications  Medication Sig Dispense Refill  . citalopram (CELEXA) 20 MG tablet Take 1 tablet (20 mg total) by mouth daily. Due for follow up visit 90 tablet 1  . cyclobenzaprine (FLEXERIL) 10 MG tablet Take 1 tablet (10 mg total) by mouth at bedtime as needed for muscle spasms. 30 tablet 1  . lisinopril (PRINIVIL,ZESTRIL) 20 MG tablet Take 1 tablet (20 mg total)  by mouth daily. 30 tablet 5  . meloxicam (MOBIC) 15 MG tablet Take 1 tablet (15 mg total) by mouth daily as needed for pain. 30 tablet 2   No current facility-administered medications for this visit.    No Known Allergies     Objective:  BP (!) 153/89   Pulse 92   Wt 255 lb (115.7 kg)   BMI 35.57 kg/m  Gen:  alert, not ill-appearing, no distress, appropriate for age, muscular build HEENT: head normocephalic without obvious abnormality, conjunctiva and cornea clear, trachea midline Pulm: Normal work of breathing, normal phonation, clear to auscultation bilaterally, no wheezes, rales or rhonchi CV: Normal rate, regular rhythm, s1 and s2 distinct, no murmurs, clicks or rubs  Neuro: alert and oriented x 3, no tremor MSK: extremities atraumatic, normal gait and station Skin: intact, no rashes on exposed skin, no jaundice, no cyanosis Psych: well-groomed, cooperative, good eye contact, euthymic mood, affect mood-congruent, speech is articulate, and thought processes clear and goal-directed    Results for orders placed or performed in visit on 05/03/17 (from the past 72 hour(s))  Urinalysis, Routine w reflex microscopic     Status: Abnormal   Collection Time: 05/03/17 10:13 AM  Result Value Ref Range  Color, Urine YELLOW YELLOW   APPearance CLEAR CLEAR   Specific Gravity, Urine 1.016 1.001 - 1.03   pH 5.5 5.0 - 8.0   Glucose, UA NEGATIVE NEGATIVE   Bilirubin Urine NEGATIVE NEGATIVE   Ketones, ur 2+ (A) NEGATIVE   Hgb urine dipstick NEGATIVE NEGATIVE   Protein, ur NEGATIVE NEGATIVE   Nitrite NEGATIVE NEGATIVE   Leukocytes, UA NEGATIVE NEGATIVE  Comprehensive metabolic panel     Status: Abnormal   Collection Time: 05/03/17 10:13 AM  Result Value Ref Range   Glucose, Bld 80 65 - 99 mg/dL    Comment: .            Fasting reference interval .    BUN 16 7 - 25 mg/dL   Creat 6.961.52 (H) 2.950.70 - 1.33 mg/dL    Comment: For patients >52 years of age, the reference limit for  Creatinine is approximately 13% higher for people identified as African-American. .    BUN/Creatinine Ratio 11 6 - 22 (calc)   Sodium 137 135 - 146 mmol/L   Potassium 4.5 3.5 - 5.3 mmol/L   Chloride 100 98 - 110 mmol/L   CO2 27 20 - 32 mmol/L   Calcium 9.2 8.6 - 10.3 mg/dL   Total Protein 7.0 6.1 - 8.1 g/dL   Albumin 4.5 3.6 - 5.1 g/dL   Globulin 2.5 1.9 - 3.7 g/dL (calc)   AG Ratio 1.8 1.0 - 2.5 (calc)   Total Bilirubin 0.6 0.2 - 1.2 mg/dL   Alkaline phosphatase (APISO) 36 (L) 40 - 115 U/L   AST 47 (H) 10 - 35 U/L   ALT 37 9 - 46 U/L  Lipid Panel w/reflex Direct LDL     Status: Abnormal   Collection Time: 05/03/17 10:13 AM  Result Value Ref Range   Cholesterol 306 (H) <200 mg/dL   HDL 25 (L) >28>40 mg/dL   Triglycerides 413150 (H) <150 mg/dL   LDL Cholesterol (Calc) 250 (H) mg/dL (calc)    Comment: LDL-C levels > or = 190 mg/dL may indicate familial  hypercholesterolemia (FH). Clinical assessment and  measurement of blood lipid levels should be  considered for all first degree relatives of  patients with an FH diagnosis.  For questions about testing for familial hypercholesterolemia, please call Engineer, [facility] at [email or web address]. Wardell HonourJacobson T, et al. J National Lipid Association  Recommendations for Patient-Centered Management of  Dyslipidemia: Part 1 Journal of Clinical Lipidology  2015;9(2), 129-169. Reference range: <100 . Desirable range <100 mg/dL for primary prevention;   <70 mg/dL for patients with CHD or diabetic patients  with > or = 2 CHD risk factors. Marland Kitchen. LDL-C is now calculated using the Martin-Hopkins  calculation, which is a validated novel method providing  better accuracy than the Friedewald equation in the  estimation of LDL-C.  Horald PollenMartin SS et al. Lenox AhrJAMA. 2440;102(722013;310(19): 2061-2068  (http://education.[facility].com/faq/FAQ164)    Total CHOL/HDL Ratio 12.2 (H) <5.0 (calc)   Non-HDL Cholesterol (Calc) 281 (H) <130 mg/dL (calc)    Comment:  Non-HDL level > or = 220 is very high and may indicate  genetic familial hypercholesterolemia (FH). Clinical  assessment and measurement of blood lipid levels  should be considered for all first-degree relatives  of patients with an FH diagnosis. . For patients with diabetes plus 1 major ASCVD risk  factor, treating to a non-HDL-C goal of <100 mg/dL  (LDL-C of <53<70 mg/dL) is considered a therapeutic  option.   CBC     Status: None  Collection Time: 05/03/17 10:13 AM  Result Value Ref Range   WBC 8.6 3.8 - 10.8 Thousand/uL   RBC 5.41 4.20 - 5.80 Million/uL   Hemoglobin 16.8 13.2 - 17.1 g/dL   HCT 40.9 81.1 - 91.4 %   MCV 88.7 80.0 - 100.0 fL   MCH 31.1 27.0 - 33.0 pg   MCHC 35.0 32.0 - 36.0 g/dL   RDW 78.2 95.6 - 21.3 %   Platelets 318 140 - 400 Thousand/uL   MPV 9.4 7.5 - 12.5 fL   No results found.    Assessment and Plan: 52 y.o. male with   1. Essential hypertension BP Readings from Last 3 Encounters:  05/03/17 (!) 153/89  04/01/16 125/85  02/17/15 (!) 142/87  - attempting to start ACE again. Patient is amenable to medication today. Counseled on therapeutic lifestyle changes.  - Urinalysis, Routine w reflex microscopic - Comprehensive metabolic panel - Lipid Panel w/reflex Direct LDL - lisinopril (PRINIVIL,ZESTRIL) 20 MG tablet; Take 1 tablet (20 mg total) by mouth daily.  Dispense: 30 tablet; Refill: 5  2. Dyslipidemia, goal LDL below 100 - Lipid Panel w/reflex Direct LDL  3. Screening for blood or protein in urine - Urinalysis, Routine w reflex microscopic  4. Anxiety and depression - PHQ2=0 - citalopram (CELEXA) 20 MG tablet; Take 1 tablet (20 mg total) by mouth daily. Due for follow up visit  Dispense: 90 tablet; Refill: 1  5. Lumbar degenerative disc disease - cyclobenzaprine (FLEXERIL) 10 MG tablet; Take 1 tablet (10 mg total) by mouth at bedtime as needed for muscle spasms.  Dispense: 30 tablet; Refill: 1 - meloxicam (MOBIC) 15 MG tablet; Take 1 tablet  (15 mg total) by mouth daily as needed for pain.  Dispense: 30 tablet; Refill: 2  6. Hypogonadism male Lab Results  Component Value Date   TESTOSTERONE 194 (L) 04/01/2016  - discussed that we cannot treat this while he is using anabolic steroids  7. Screening for blood disease - CBC  8. Acute kidney injury (HCC) - COMPLETE METABOLIC PANEL WITH GFR  9. Anabolic steroid abuse - discussed long-term risks including liver and kidney injury, hypertension and heart failure, and increased risk of MI and stroke. Patient is open to reducing/tapering. Encouraged him to schedule an appointment with Sports Medicine  10. Ketonuria   Patient education and anticipatory guidance given Patient agrees with treatment plan Follow-up in 2 weeks for nurse BP check, then every 6 months for medication management or sooner as needed if symptoms worsen or fail to improve  Levonne Hubert PA-C

## 2017-05-03 NOTE — Patient Instructions (Addendum)
For your blood pressure: - Goal <130/80 - start Lisinopril 20 mg every morning - baby aspirin 81 mg daily to help prevent heart attack/stroke - monitor and log blood pressures at home - check around the same time each day in a relaxed setting - Limit salt to <2000 mg/day - Follow DASH eating plan - limit alcohol to 2 standard drinks per day for men and 1 per day for women - avoid tobacco products - weight loss: 7% of current body weight - follow-up every 6 months for your blood pressure

## 2017-05-04 ENCOUNTER — Encounter: Payer: Self-pay | Admitting: Physician Assistant

## 2017-05-04 DIAGNOSIS — R824 Acetonuria: Secondary | ICD-10-CM | POA: Insufficient documentation

## 2017-05-04 DIAGNOSIS — T387X5A Adverse effect of androgens and anabolic congeners, initial encounter: Secondary | ICD-10-CM | POA: Insufficient documentation

## 2017-05-04 DIAGNOSIS — N179 Acute kidney failure, unspecified: Secondary | ICD-10-CM | POA: Insufficient documentation

## 2017-05-04 DIAGNOSIS — I10 Essential (primary) hypertension: Secondary | ICD-10-CM | POA: Insufficient documentation

## 2017-05-04 DIAGNOSIS — E785 Hyperlipidemia, unspecified: Secondary | ICD-10-CM | POA: Insufficient documentation

## 2017-05-04 LAB — URINALYSIS, ROUTINE W REFLEX MICROSCOPIC
Bilirubin Urine: NEGATIVE
Glucose, UA: NEGATIVE
Hgb urine dipstick: NEGATIVE
Leukocytes, UA: NEGATIVE
Nitrite: NEGATIVE
Protein, ur: NEGATIVE
Specific Gravity, Urine: 1.016 (ref 1.001–1.03)
pH: 5.5 (ref 5.0–8.0)

## 2017-05-04 LAB — LIPID PANEL W/REFLEX DIRECT LDL
Cholesterol: 306 mg/dL — ABNORMAL HIGH (ref <200)
HDL: 25 mg/dL — ABNORMAL LOW (ref >40)
LDL Cholesterol (Calc): 250 mg/dL (calc) — ABNORMAL HIGH
Non-HDL Cholesterol (Calc): 281 mg/dL (calc) — ABNORMAL HIGH (ref <130)
Total CHOL/HDL Ratio: 12.2 (calc) — ABNORMAL HIGH (ref <5.0)
Triglycerides: 150 mg/dL — ABNORMAL HIGH (ref <150)

## 2017-05-04 LAB — CBC
HCT: 48 % (ref 38.5–50.0)
Hemoglobin: 16.8 g/dL (ref 13.2–17.1)
MCH: 31.1 pg (ref 27.0–33.0)
MCHC: 35 g/dL (ref 32.0–36.0)
MCV: 88.7 fL (ref 80.0–100.0)
MPV: 9.4 fL (ref 7.5–12.5)
Platelets: 318 10*3/uL (ref 140–400)
RBC: 5.41 10*6/uL (ref 4.20–5.80)
RDW: 12.8 % (ref 11.0–15.0)
WBC: 8.6 10*3/uL (ref 3.8–10.8)

## 2017-05-04 LAB — COMPREHENSIVE METABOLIC PANEL
AG Ratio: 1.8 (calc) (ref 1.0–2.5)
ALT: 37 U/L (ref 9–46)
AST: 47 U/L — ABNORMAL HIGH (ref 10–35)
Albumin: 4.5 g/dL (ref 3.6–5.1)
Alkaline phosphatase (APISO): 36 U/L — ABNORMAL LOW (ref 40–115)
BUN/Creatinine Ratio: 11 (calc) (ref 6–22)
BUN: 16 mg/dL (ref 7–25)
CO2: 27 mmol/L (ref 20–32)
Calcium: 9.2 mg/dL (ref 8.6–10.3)
Chloride: 100 mmol/L (ref 98–110)
Creat: 1.52 mg/dL — ABNORMAL HIGH (ref 0.70–1.33)
Globulin: 2.5 g/dL (calc) (ref 1.9–3.7)
Glucose, Bld: 80 mg/dL (ref 65–99)
Potassium: 4.5 mmol/L (ref 3.5–5.3)
Sodium: 137 mmol/L (ref 135–146)
Total Bilirubin: 0.6 mg/dL (ref 0.2–1.2)
Total Protein: 7 g/dL (ref 6.1–8.1)

## 2017-05-04 NOTE — Progress Notes (Signed)
He has acute kidney injury. Liver enzymes are mildly abnormal Cholesterol has doubled since last year and is extremely high These are all likely adverse effects of steroids. I highly encourage him to reduce his dose and talk to Sports Medicine about tapering off I would like to recheck his kidney function on Monday or Tuesday. Drink plenty of water, avoid nutritional supplements, and avoid NSAIDs

## 2017-05-17 ENCOUNTER — Ambulatory Visit (INDEPENDENT_AMBULATORY_CARE_PROVIDER_SITE_OTHER): Payer: BLUE CROSS/BLUE SHIELD | Admitting: Physician Assistant

## 2017-05-17 VITALS — BP 135/67 | HR 94 | Temp 98.2°F | Resp 18 | Wt 249.0 lb

## 2017-05-17 DIAGNOSIS — I10 Essential (primary) hypertension: Secondary | ICD-10-CM

## 2017-05-17 NOTE — Progress Notes (Signed)
Patient should continue Lisinopril daily He needs his kidney function checked this week due to elevated serum creatinine He should see me for follow-up in the office in 3 months

## 2017-05-17 NOTE — Progress Notes (Signed)
HPI: Patient is here to have his blood pressure checked. Patient denies chest pain, palpitations, shortness of breath or medication problems.    Vitals:   05/17/17 0955  BP: 135/67  Pulse: 94  Resp: 18  Temp: 98.2 F (36.8 C)  SpO2: 98%    BP Readings from Last 3 Encounters:  05/17/17 135/67  05/03/17 (!) 153/89  04/01/16 125/85    Assessment and Plan: Blood pressure reading was within normal limits. Patient advised to schedule nurse visit for blood pressure check in two weeks - he will receive a call from out office if provider changes anything. Patient stated he understood.

## 2017-05-19 ENCOUNTER — Telehealth: Payer: Self-pay

## 2017-05-19 NOTE — Telephone Encounter (Signed)
Pt was seen on 05/03/17.  After his visit he got labs done.  Was unable to get contact pt by phone so results was mailed out on 05/10/17.  He was not happy that I mailed him his results.  He stated that he would like a call back from provider instead of assistants. -EH/RMA

## 2017-05-19 NOTE — Telephone Encounter (Signed)
Called patient and left voicemail Will try again after clinic or Monday

## 2017-05-22 NOTE — Telephone Encounter (Signed)
Called and spoke with patient at 11:57 am today. He had shared some private healthcare information at his last visit that was relevant to his abnormal lab results. Unfortunately, when nursing staff called to give results verbally patient was unavailable on 2 attempts. His results were automatically mailed to his house. He received his results, but was upset about the possibility of his mail being opened by a family member and this information being shared without his permission. Patient requested removal of the information from his chart. Explained we are unable to remove medical history, but going forward, we will not mail results.

## 2017-05-31 ENCOUNTER — Ambulatory Visit: Payer: BLUE CROSS/BLUE SHIELD

## 2017-10-20 DIAGNOSIS — L2082 Flexural eczema: Secondary | ICD-10-CM | POA: Diagnosis not present

## 2017-10-20 DIAGNOSIS — L814 Other melanin hyperpigmentation: Secondary | ICD-10-CM | POA: Diagnosis not present

## 2017-10-20 DIAGNOSIS — L820 Inflamed seborrheic keratosis: Secondary | ICD-10-CM | POA: Diagnosis not present

## 2017-10-20 DIAGNOSIS — L579 Skin changes due to chronic exposure to nonionizing radiation, unspecified: Secondary | ICD-10-CM | POA: Diagnosis not present

## 2017-11-06 ENCOUNTER — Ambulatory Visit: Payer: BLUE CROSS/BLUE SHIELD | Admitting: Physician Assistant

## 2017-12-25 ENCOUNTER — Other Ambulatory Visit: Payer: Self-pay | Admitting: Physician Assistant

## 2017-12-25 DIAGNOSIS — F329 Major depressive disorder, single episode, unspecified: Secondary | ICD-10-CM

## 2017-12-25 DIAGNOSIS — F32A Depression, unspecified: Secondary | ICD-10-CM

## 2017-12-25 DIAGNOSIS — F419 Anxiety disorder, unspecified: Principal | ICD-10-CM

## 2017-12-26 ENCOUNTER — Other Ambulatory Visit: Payer: Self-pay | Admitting: Physician Assistant

## 2017-12-26 DIAGNOSIS — F419 Anxiety disorder, unspecified: Principal | ICD-10-CM

## 2017-12-26 DIAGNOSIS — F329 Major depressive disorder, single episode, unspecified: Secondary | ICD-10-CM

## 2017-12-26 DIAGNOSIS — F32A Depression, unspecified: Secondary | ICD-10-CM

## 2018-01-18 ENCOUNTER — Telehealth: Payer: Self-pay

## 2018-01-18 DIAGNOSIS — F329 Major depressive disorder, single episode, unspecified: Secondary | ICD-10-CM

## 2018-01-18 DIAGNOSIS — F419 Anxiety disorder, unspecified: Principal | ICD-10-CM

## 2018-01-18 DIAGNOSIS — F32A Depression, unspecified: Secondary | ICD-10-CM

## 2018-01-18 MED ORDER — CITALOPRAM HYDROBROMIDE 20 MG PO TABS: ORAL_TABLET | ORAL | 0 refills | Status: AC

## 2018-01-18 NOTE — Telephone Encounter (Signed)
Pt notified -EH/RMA  

## 2018-01-18 NOTE — Telephone Encounter (Signed)
Sent in 45 days so he has enough time to establish with a new practice

## 2018-01-18 NOTE — Telephone Encounter (Addendum)
A 15 day supply of Celexa was sent to the pharmacy on 12/25/17. Patient was notified that he was due for an appointment.  He has left a vm stating that he no longer wants to come to the practice due to our location being far from his home. He is requesting a 90 day refill. Please advise. -EH/RMA

## 2018-03-12 DIAGNOSIS — F419 Anxiety disorder, unspecified: Secondary | ICD-10-CM | POA: Diagnosis not present

## 2018-03-26 DIAGNOSIS — L2082 Flexural eczema: Secondary | ICD-10-CM | POA: Diagnosis not present

## 2018-03-26 DIAGNOSIS — L819 Disorder of pigmentation, unspecified: Secondary | ICD-10-CM | POA: Diagnosis not present

## 2018-03-26 DIAGNOSIS — L579 Skin changes due to chronic exposure to nonionizing radiation, unspecified: Secondary | ICD-10-CM | POA: Diagnosis not present

## 2018-03-26 DIAGNOSIS — L239 Allergic contact dermatitis, unspecified cause: Secondary | ICD-10-CM | POA: Diagnosis not present

## 2018-04-23 DIAGNOSIS — H524 Presbyopia: Secondary | ICD-10-CM | POA: Diagnosis not present

## 2018-04-24 DIAGNOSIS — D485 Neoplasm of uncertain behavior of skin: Secondary | ICD-10-CM | POA: Diagnosis not present

## 2018-04-24 DIAGNOSIS — L819 Disorder of pigmentation, unspecified: Secondary | ICD-10-CM | POA: Diagnosis not present

## 2018-04-24 DIAGNOSIS — R21 Rash and other nonspecific skin eruption: Secondary | ICD-10-CM | POA: Diagnosis not present

## 2018-04-24 DIAGNOSIS — L579 Skin changes due to chronic exposure to nonionizing radiation, unspecified: Secondary | ICD-10-CM | POA: Diagnosis not present

## 2018-04-24 DIAGNOSIS — L280 Lichen simplex chronicus: Secondary | ICD-10-CM | POA: Diagnosis not present

## 2018-06-18 DIAGNOSIS — L738 Other specified follicular disorders: Secondary | ICD-10-CM | POA: Diagnosis not present

## 2018-06-18 DIAGNOSIS — L814 Other melanin hyperpigmentation: Secondary | ICD-10-CM | POA: Diagnosis not present

## 2018-06-18 DIAGNOSIS — L2082 Flexural eczema: Secondary | ICD-10-CM | POA: Diagnosis not present

## 2018-06-18 DIAGNOSIS — L981 Factitial dermatitis: Secondary | ICD-10-CM | POA: Diagnosis not present

## 2018-07-10 DIAGNOSIS — L814 Other melanin hyperpigmentation: Secondary | ICD-10-CM | POA: Diagnosis not present

## 2018-07-10 DIAGNOSIS — L2082 Flexural eczema: Secondary | ICD-10-CM | POA: Diagnosis not present

## 2018-07-10 DIAGNOSIS — L579 Skin changes due to chronic exposure to nonionizing radiation, unspecified: Secondary | ICD-10-CM | POA: Diagnosis not present

## 2018-07-10 DIAGNOSIS — L738 Other specified follicular disorders: Secondary | ICD-10-CM | POA: Diagnosis not present

## 2020-03-05 ENCOUNTER — Ambulatory Visit (INDEPENDENT_AMBULATORY_CARE_PROVIDER_SITE_OTHER): Payer: Managed Care, Other (non HMO) | Admitting: Sports Medicine

## 2020-03-05 ENCOUNTER — Ambulatory Visit (INDEPENDENT_AMBULATORY_CARE_PROVIDER_SITE_OTHER): Payer: Managed Care, Other (non HMO)

## 2020-03-05 ENCOUNTER — Other Ambulatory Visit: Payer: Self-pay

## 2020-03-05 DIAGNOSIS — M25521 Pain in right elbow: Secondary | ICD-10-CM | POA: Diagnosis not present

## 2020-03-05 DIAGNOSIS — S46219A Strain of muscle, fascia and tendon of other parts of biceps, unspecified arm, initial encounter: Secondary | ICD-10-CM

## 2020-03-05 DIAGNOSIS — M25522 Pain in left elbow: Secondary | ICD-10-CM | POA: Diagnosis not present

## 2020-03-05 NOTE — Progress Notes (Signed)
    Procedures performed today:    None.  Independent interpretation of notes and tests performed by another provider:   None.  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 Symptoms have continued to improve. His exam is for the most part unremarkable with the exception of slight weakness to supination. This is what is typically expected with a distal biceps rupture, his elbow flexion and extension will remain preserved due to good continuity of his brachialis and triceps. We had a long discussion about options both diagnostic and surgical. There are several muscles in his forearm/elbow that can take over the function of supination, and I think he will improve considerably over time. At this point we both came to the decision that it is probably not worth proceeding with MRIs for surgical planning. He will do some biceps rehabilitation exercises to work on improving firing of the distal bicep agonists, I would like some x-rays, return to see me in approximately 6 to 8 weeks to reevaluate.    ___________________________________________ Ihor Austin. Benjamin Stain, M.D., ABFM., CAQSM. Primary Care and Sports Medicine [facility]  Adjunct Instructor of Family Medicine  [employer]

## 2020-03-05 NOTE — Assessment & Plan Note (Signed)
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 Symptoms have continued to improve. His exam is for the most part unremarkable with the exception of slight weakness to supination. This is what is typically expected with a distal biceps rupture, his elbow flexion and extension will remain preserved due to good continuity of his brachialis and triceps. We had a long discussion about options both diagnostic and surgical. There are several muscles in his forearm/elbow that can take over the function of supination, and I think he will improve considerably over time. At this point we both came to the decision that it is probably not worth proceeding with MRIs for surgical planning. He will do some biceps rehabilitation exercises to work on improving firing of the distal bicep agonists, I would like some x-rays, return to see me in approximately 6 to 8 weeks to reevaluate.

## 2020-05-04 ENCOUNTER — Ambulatory Visit: Payer: Managed Care, Other (non HMO) | Admitting: Sports Medicine

## 2022-05-02 ENCOUNTER — Telehealth: Payer: Self-pay | Admitting: General Practice

## 2022-05-02 NOTE — Transitions of Care (Post Inpatient/ED Visit) (Signed)
   05/02/2022  Name: Gabriel Little MRN: [id number] DOB: 10/21/1965  Today's TOC FU Call Status: Today's TOC FU Call Status:: Unsuccessul Call (1st Attempt) Unsuccessful Call (1st Attempt) Date: 05/02/22  Attempted to reach the patient regarding the most recent Inpatient/ED visit.  Follow Up Plan: Additional outreach attempts will be made to reach the patient to complete the Transitions of Care (Post Inpatient/ED visit) call.   Signature : Tinnie Gens, RN BSN

## 2022-05-03 NOTE — Transitions of Care (Post Inpatient/ED Visit) (Signed)
   05/03/2022  Name: Gabriel Little MRN: [id number] DOB: 1965-07-16  Today's TOC FU Call Status: Today's TOC FU Call Status:: Unsuccessful Call (2nd Attempt) Unsuccessful Call (1st Attempt) Date: 05/02/22 Unsuccessful Call (2nd Attempt) Date: 05/03/22  Attempted to reach the patient regarding the most recent Inpatient/ED visit.  Follow Up Plan: Additional outreach attempts will be made to reach the patient to complete the Transitions of Care (Post Inpatient/ED visit) call.   Signature Tinnie Gens, RN

## 2022-05-04 NOTE — Transitions of Care (Post Inpatient/ED Visit) (Signed)
   05/04/2022  Name: Gabriel Little MRN: [id number] DOB: 1965-06-22  Today's TOC FU Call Status: Today's TOC FU Call Status:: Unsuccessful Call (3rd Attempt) Unsuccessful Call (1st Attempt) Date: 05/02/22 Unsuccessful Call (2nd Attempt) Date: 05/03/22 Unsuccessful Call (3rd Attempt) Date: 05/04/22  Attempted to reach the patient regarding the most recent Inpatient/ED visit.  Follow Up Plan: No further outreach attempts will be made at this time. We have been unable to contact the patient.  Signature Tinnie Gens, RN
